# Patient Record
Sex: Female | Born: 1955 | Race: White | Hispanic: No | Marital: Married | Smoking: Never smoker
Health system: Southern US, Community
[De-identification: ages and names within clinical notes are randomized; demographics above are authoritative.]

## PROBLEM LIST (undated history)

## (undated) DIAGNOSIS — D3A026 Benign carcinoid tumor of the rectum: Secondary | ICD-10-CM

## (undated) DIAGNOSIS — K589 Irritable bowel syndrome without diarrhea: Secondary | ICD-10-CM

## (undated) DIAGNOSIS — Z973 Presence of spectacles and contact lenses: Secondary | ICD-10-CM

## (undated) DIAGNOSIS — N950 Postmenopausal bleeding: Secondary | ICD-10-CM

## (undated) DIAGNOSIS — Z8719 Personal history of other diseases of the digestive system: Secondary | ICD-10-CM

## (undated) DIAGNOSIS — K219 Gastro-esophageal reflux disease without esophagitis: Secondary | ICD-10-CM

## (undated) DIAGNOSIS — N840 Polyp of corpus uteri: Secondary | ICD-10-CM

## (undated) DIAGNOSIS — Z860101 Personal history of adenomatous and serrated colon polyps: Secondary | ICD-10-CM

## (undated) DIAGNOSIS — E785 Hyperlipidemia, unspecified: Secondary | ICD-10-CM

## (undated) DIAGNOSIS — Z86010 Personal history of colonic polyps: Secondary | ICD-10-CM

## (undated) DIAGNOSIS — K648 Other hemorrhoids: Secondary | ICD-10-CM

## (undated) DIAGNOSIS — M8580 Other specified disorders of bone density and structure, unspecified site: Secondary | ICD-10-CM

## (undated) HISTORY — DX: Hyperlipidemia, unspecified: E78.5

## (undated) HISTORY — PX: TONSILLECTOMY: SUR1361

## (undated) HISTORY — PX: COLONOSCOPY: SHX174

## (undated) HISTORY — DX: Gastro-esophageal reflux disease without esophagitis: K21.9

## (undated) HISTORY — DX: Benign carcinoid tumor of the rectum: D3A.026

## (undated) HISTORY — DX: Irritable bowel syndrome, unspecified: K58.9

## (undated) HISTORY — DX: Other specified disorders of bone density and structure, unspecified site: M85.80

## (undated) HISTORY — DX: Other hemorrhoids: K64.8

---

## 1983-01-02 HISTORY — PX: DILATION AND CURETTAGE OF UTERUS: SHX78

## 1993-01-01 HISTORY — PX: UMBILICAL HERNIA REPAIR: SHX196

## 1997-08-05 ENCOUNTER — Ambulatory Visit (HOSPITAL_COMMUNITY): Admission: RE | Admit: 1997-08-05 | Discharge: 1997-08-05 | Payer: Self-pay | Admitting: *Deleted

## 1999-08-08 ENCOUNTER — Ambulatory Visit (HOSPITAL_COMMUNITY): Admission: RE | Admit: 1999-08-08 | Discharge: 1999-08-08 | Payer: Self-pay | Admitting: *Deleted

## 2000-02-02 ENCOUNTER — Encounter (INDEPENDENT_AMBULATORY_CARE_PROVIDER_SITE_OTHER): Payer: Self-pay | Admitting: Specialist

## 2000-02-02 ENCOUNTER — Other Ambulatory Visit: Admission: RE | Admit: 2000-02-02 | Discharge: 2000-02-02 | Payer: Self-pay | Admitting: Obstetrics and Gynecology

## 2000-10-04 ENCOUNTER — Ambulatory Visit (HOSPITAL_BASED_OUTPATIENT_CLINIC_OR_DEPARTMENT_OTHER): Admission: RE | Admit: 2000-10-04 | Discharge: 2000-10-04 | Payer: Self-pay | Admitting: Orthopedic Surgery

## 2000-10-04 HISTORY — PX: WRIST GANGLION EXCISION: SHX840

## 2000-10-23 ENCOUNTER — Encounter: Payer: Self-pay | Admitting: Internal Medicine

## 2000-10-23 ENCOUNTER — Ambulatory Visit (HOSPITAL_COMMUNITY): Admission: RE | Admit: 2000-10-23 | Discharge: 2000-10-23 | Payer: Self-pay | Admitting: Internal Medicine

## 2000-11-12 ENCOUNTER — Other Ambulatory Visit: Admission: RE | Admit: 2000-11-12 | Discharge: 2000-11-12 | Payer: Self-pay | Admitting: Obstetrics and Gynecology

## 2001-05-20 ENCOUNTER — Other Ambulatory Visit: Admission: RE | Admit: 2001-05-20 | Discharge: 2001-05-20 | Payer: Self-pay | Admitting: Obstetrics and Gynecology

## 2001-06-12 ENCOUNTER — Ambulatory Visit (HOSPITAL_COMMUNITY): Admission: RE | Admit: 2001-06-12 | Discharge: 2001-06-12 | Payer: Self-pay | Admitting: Internal Medicine

## 2001-06-12 ENCOUNTER — Encounter: Payer: Self-pay | Admitting: Internal Medicine

## 2001-10-29 ENCOUNTER — Ambulatory Visit (HOSPITAL_COMMUNITY): Admission: RE | Admit: 2001-10-29 | Discharge: 2001-10-29 | Payer: Self-pay | Admitting: Obstetrics and Gynecology

## 2001-10-29 ENCOUNTER — Encounter: Payer: Self-pay | Admitting: Obstetrics and Gynecology

## 2001-11-13 ENCOUNTER — Other Ambulatory Visit: Admission: RE | Admit: 2001-11-13 | Discharge: 2001-11-13 | Payer: Self-pay | Admitting: Obstetrics and Gynecology

## 2002-11-24 ENCOUNTER — Ambulatory Visit (HOSPITAL_COMMUNITY): Admission: RE | Admit: 2002-11-24 | Discharge: 2002-11-24 | Payer: Self-pay | Admitting: Obstetrics and Gynecology

## 2002-12-03 ENCOUNTER — Other Ambulatory Visit: Admission: RE | Admit: 2002-12-03 | Discharge: 2002-12-03 | Payer: Self-pay | Admitting: Obstetrics and Gynecology

## 2003-11-30 ENCOUNTER — Ambulatory Visit (HOSPITAL_COMMUNITY): Admission: RE | Admit: 2003-11-30 | Discharge: 2003-11-30 | Payer: Self-pay | Admitting: Obstetrics and Gynecology

## 2003-12-13 ENCOUNTER — Other Ambulatory Visit: Admission: RE | Admit: 2003-12-13 | Discharge: 2003-12-13 | Payer: Self-pay | Admitting: Obstetrics and Gynecology

## 2004-10-09 ENCOUNTER — Ambulatory Visit: Payer: Self-pay | Admitting: Gastroenterology

## 2004-10-16 ENCOUNTER — Ambulatory Visit: Payer: Self-pay | Admitting: Gastroenterology

## 2004-11-10 ENCOUNTER — Ambulatory Visit: Payer: Self-pay | Admitting: Gastroenterology

## 2004-11-29 ENCOUNTER — Ambulatory Visit: Payer: Self-pay | Admitting: Gastroenterology

## 2004-12-12 ENCOUNTER — Ambulatory Visit: Payer: Self-pay | Admitting: Gastroenterology

## 2004-12-12 ENCOUNTER — Encounter (INDEPENDENT_AMBULATORY_CARE_PROVIDER_SITE_OTHER): Payer: Self-pay | Admitting: *Deleted

## 2004-12-12 DIAGNOSIS — D126 Benign neoplasm of colon, unspecified: Secondary | ICD-10-CM | POA: Insufficient documentation

## 2004-12-12 DIAGNOSIS — K2980 Duodenitis without bleeding: Secondary | ICD-10-CM | POA: Insufficient documentation

## 2004-12-12 HISTORY — PX: ESOPHAGOGASTRODUODENOSCOPY: SHX1529

## 2004-12-28 ENCOUNTER — Other Ambulatory Visit: Admission: RE | Admit: 2004-12-28 | Discharge: 2004-12-28 | Payer: Self-pay | Admitting: Obstetrics and Gynecology

## 2005-01-03 ENCOUNTER — Ambulatory Visit: Payer: Self-pay | Admitting: Gastroenterology

## 2005-01-23 ENCOUNTER — Ambulatory Visit (HOSPITAL_COMMUNITY): Admission: RE | Admit: 2005-01-23 | Discharge: 2005-01-23 | Payer: Self-pay | Admitting: Internal Medicine

## 2005-02-12 ENCOUNTER — Ambulatory Visit: Payer: Self-pay | Admitting: Gastroenterology

## 2005-05-14 ENCOUNTER — Ambulatory Visit: Payer: Self-pay | Admitting: Gastroenterology

## 2005-08-22 ENCOUNTER — Ambulatory Visit: Payer: Self-pay | Admitting: Gastroenterology

## 2005-12-04 ENCOUNTER — Other Ambulatory Visit: Admission: RE | Admit: 2005-12-04 | Discharge: 2005-12-04 | Payer: Self-pay | Admitting: Obstetrics and Gynecology

## 2006-03-01 ENCOUNTER — Encounter: Admission: RE | Admit: 2006-03-01 | Discharge: 2006-03-01 | Payer: Self-pay | Admitting: Obstetrics and Gynecology

## 2006-05-29 ENCOUNTER — Ambulatory Visit: Payer: Self-pay

## 2006-06-03 ENCOUNTER — Ambulatory Visit: Payer: Self-pay | Admitting: Gastroenterology

## 2006-11-13 ENCOUNTER — Ambulatory Visit: Payer: Self-pay | Admitting: Internal Medicine

## 2006-12-06 ENCOUNTER — Other Ambulatory Visit: Admission: RE | Admit: 2006-12-06 | Discharge: 2006-12-06 | Payer: Self-pay | Admitting: Obstetrics and Gynecology

## 2007-02-18 DIAGNOSIS — K589 Irritable bowel syndrome without diarrhea: Secondary | ICD-10-CM | POA: Insufficient documentation

## 2007-02-18 DIAGNOSIS — E785 Hyperlipidemia, unspecified: Secondary | ICD-10-CM | POA: Insufficient documentation

## 2007-02-18 DIAGNOSIS — K219 Gastro-esophageal reflux disease without esophagitis: Secondary | ICD-10-CM | POA: Insufficient documentation

## 2007-02-18 DIAGNOSIS — N6019 Diffuse cystic mastopathy of unspecified breast: Secondary | ICD-10-CM | POA: Insufficient documentation

## 2007-02-18 DIAGNOSIS — D509 Iron deficiency anemia, unspecified: Secondary | ICD-10-CM | POA: Insufficient documentation

## 2007-04-08 ENCOUNTER — Encounter: Admission: RE | Admit: 2007-04-08 | Discharge: 2007-04-08 | Payer: Self-pay | Admitting: Obstetrics and Gynecology

## 2007-05-20 ENCOUNTER — Encounter: Payer: Self-pay | Admitting: Internal Medicine

## 2007-07-10 ENCOUNTER — Encounter: Admission: RE | Admit: 2007-07-10 | Discharge: 2007-07-10 | Payer: Self-pay | Admitting: Orthopedic Surgery

## 2007-08-05 ENCOUNTER — Telehealth: Payer: Self-pay | Admitting: Internal Medicine

## 2007-12-09 ENCOUNTER — Encounter: Payer: Self-pay | Admitting: Obstetrics and Gynecology

## 2007-12-09 ENCOUNTER — Ambulatory Visit: Payer: Self-pay | Admitting: Obstetrics and Gynecology

## 2007-12-09 ENCOUNTER — Other Ambulatory Visit: Admission: RE | Admit: 2007-12-09 | Discharge: 2007-12-09 | Payer: Self-pay | Admitting: Obstetrics and Gynecology

## 2008-06-02 ENCOUNTER — Encounter: Admission: RE | Admit: 2008-06-02 | Discharge: 2008-06-02 | Payer: Self-pay | Admitting: Obstetrics and Gynecology

## 2008-12-09 ENCOUNTER — Ambulatory Visit: Payer: Self-pay | Admitting: Obstetrics and Gynecology

## 2008-12-09 ENCOUNTER — Other Ambulatory Visit: Admission: RE | Admit: 2008-12-09 | Discharge: 2008-12-09 | Payer: Self-pay | Admitting: Obstetrics and Gynecology

## 2009-02-08 ENCOUNTER — Ambulatory Visit: Payer: Self-pay | Admitting: Obstetrics and Gynecology

## 2009-06-23 ENCOUNTER — Encounter: Admission: RE | Admit: 2009-06-23 | Discharge: 2009-06-23 | Payer: Self-pay | Admitting: Obstetrics and Gynecology

## 2009-10-24 ENCOUNTER — Encounter (INDEPENDENT_AMBULATORY_CARE_PROVIDER_SITE_OTHER): Payer: Self-pay | Admitting: *Deleted

## 2009-11-28 ENCOUNTER — Encounter (INDEPENDENT_AMBULATORY_CARE_PROVIDER_SITE_OTHER): Payer: Self-pay

## 2009-11-29 ENCOUNTER — Ambulatory Visit: Payer: Self-pay | Admitting: Internal Medicine

## 2009-12-08 ENCOUNTER — Ambulatory Visit: Payer: Self-pay | Admitting: Internal Medicine

## 2009-12-09 ENCOUNTER — Encounter: Payer: Self-pay | Admitting: Internal Medicine

## 2009-12-12 ENCOUNTER — Encounter: Payer: Self-pay | Admitting: Internal Medicine

## 2010-01-05 ENCOUNTER — Ambulatory Visit
Admission: RE | Admit: 2010-01-05 | Discharge: 2010-01-05 | Payer: Self-pay | Source: Home / Self Care | Attending: Licensed Clinical Social Worker | Admitting: Licensed Clinical Social Worker

## 2010-01-18 ENCOUNTER — Other Ambulatory Visit: Payer: Self-pay | Admitting: Obstetrics and Gynecology

## 2010-01-18 ENCOUNTER — Ambulatory Visit
Admission: RE | Admit: 2010-01-18 | Discharge: 2010-01-18 | Payer: Self-pay | Source: Home / Self Care | Attending: Obstetrics and Gynecology | Admitting: Obstetrics and Gynecology

## 2010-01-18 ENCOUNTER — Other Ambulatory Visit
Admission: RE | Admit: 2010-01-18 | Discharge: 2010-01-18 | Payer: Self-pay | Source: Home / Self Care | Admitting: Obstetrics and Gynecology

## 2010-01-21 ENCOUNTER — Encounter: Payer: Self-pay | Admitting: Obstetrics and Gynecology

## 2010-01-22 ENCOUNTER — Encounter: Payer: Self-pay | Admitting: Obstetrics and Gynecology

## 2010-01-31 NOTE — Progress Notes (Signed)
Summary: recall    Phone Note Call from Patient Call back at [phone]   Caller: Patient Call For: GESSNER Reason for Call: Talk to Nurse Details for Reason: recall  Summary of Call: pt would like to know if Dr Leone Payor would like pt to come in this year for a COLON? (recall in [facility] for 12-2007 with Dr Corinda Gubler) Initial call taken by: Guadlupe [facility],  August 05, 2007 4:15 PM  Follow-up for Phone Call        will have chart reviewed by Dr Leone Payor and we will contact pt when reviewed. Follow-up by: Darcey Nora RN,  August 05, 2007 4:41 PM  Additional Follow-up for Phone Call Additional follow up Details #1::        I have reviewed chart, she had one 7 mm adenoma in 2006 (Dec) Current guidelines and standards of care indicate she should have a repeat routine colonoscopy in 12/2009 (5 yrs after last one) If she is having GI symptoms that are changed or new, might indicate a sooner exam. Can set up REV as needed. Additional Follow-up by: Iva Boop MD,  August 06, 2007 1:36 PM    Additional Follow-up for Phone Call Additional follow up Details #2::    pt notified of above, recall entered in IDX for 12-2009 Follow-up by: Darcey Nora RN,  August 06, 2007 1:42 PM

## 2010-01-31 NOTE — Miscellaneous (Signed)
Summary: Lec previsit  Clinical Lists Changes  Medications: Added new medication of MOVIPREP 100 GM  SOLR (PEG-KCL-NACL-NASULF-NA ASC-C) As per prep instructions. - Signed Rx of MOVIPREP 100 GM  SOLR (PEG-KCL-NACL-NASULF-NA ASC-C) As per prep instructions.;  #1 x 0;  Signed;  Entered by: Ulis Rias RN;  Authorized by: Iva Boop MD, [facility];  Method used: [facility]  [place] [address], [place], [place]  [address], Ph: [phone] or [phone], Fax: [phone] Allergies: Added new allergy or adverse reaction of * SULFA DRUGS Observations: Added new observation of NKA: F (11/29/2009 7:53)    Prescriptions: MOVIPREP 100 GM  SOLR (PEG-KCL-NACL-NASULF-NA ASC-C) As per prep instructions.  #1 x 0   Entered by:   Ulis Rias RN   Authorized by:   Iva Boop MD, [facility]   Signed by:   Ulis Rias RN on 11/29/2009   Method used:   [facility]        [facility]  [place] [address]       [address]       [place], [place]  [address]       Ph: [phone] or [phone]       Fax: [phone]   RxID:   [id number]

## 2010-01-31 NOTE — Letter (Signed)
Summary: [facility] Instructions  [facility]  [address] [place], [place] [address]   Phone: [phone]  Fax: [phone]       Valerie Hubbard    03-May-1955    MRN: [id number]        Procedure Day /Date:  12/08/09   Thursday     Arrival Time:  8:00am      Procedure Time:  9:00am     Location of Procedure:                    _ x_  [facility] (4th Floor)   PREPARATION FOR COLONOSCOPY WITH MOVIPREP   Starting 5 days prior to your procedure _ 12/3/11_ do not eat nuts, seeds, popcorn, corn, beans, peas,  salads, or any raw vegetables.  Do not take any fiber supplements (e.g. Metamucil, Citrucel, and Benefiber).  THE DAY BEFORE YOUR PROCEDURE         DATE:  12/07/09   DAY:  Wednesday  1.  Drink clear liquids the entire day-NO SOLID FOOD  2.  Do not drink anything colored red or purple.  Avoid juices with pulp.  No orange juice.  3.  Drink at least 64 oz. (8 glasses) of fluid/clear liquids during the day to prevent dehydration and help the prep work efficiently.  CLEAR LIQUIDS INCLUDE: Water Jello Ice Popsicles Tea (sugar ok, no milk/cream) Powdered fruit flavored drinks Coffee (sugar ok, no milk/cream) Gatorade Juice: apple, white grape, white cranberry  Lemonade Clear bullion, consomm, broth Carbonated beverages (any kind) Strained chicken noodle soup Hard Candy                             4.  In the morning, mix first dose of MoviPrep solution:    Empty 1 Pouch A and 1 Pouch B into the disposable container    Add lukewarm drinking water to the top line of the container. Mix to dissolve    Refrigerate (mixed solution should be used within 24 hrs)  5.  Begin drinking the prep at 5:00 p.m. The MoviPrep container is divided by 4 marks.   Every 15 minutes drink the solution down to the next mark (approximately 8 oz) until the full liter is complete.   6.  Follow completed prep with 16 oz of clear liquid of your choice (Nothing red or  purple).  Continue to drink clear liquids until bedtime.  7.  Before going to bed, mix second dose of MoviPrep solution:    Empty 1 Pouch A and 1 Pouch B into the disposable container    Add lukewarm drinking water to the top line of the container. Mix to dissolve    Refrigerate  THE DAY OF YOUR PROCEDURE      DATE:  12/08/09  DAY:  Thursday  Beginning at  4:00 a.m. (5 hours before procedure):         1. Every 15 minutes, drink the solution down to the next mark (approx 8 oz) until the full liter is complete.  2. Follow completed prep with 16 oz. of clear liquid of your choice.    3. You may drink clear liquids until  7:00am  (2 HOURS BEFORE PROCEDURE).   MEDICATION INSTRUCTIONS  Unless otherwise instructed, you should take regular prescription medications with a small sip of water   as early as possible the morning of your procedure.  OTHER INSTRUCTIONS  You will need a responsible adult at least 55 years of age to accompany you and drive you home.   This person must remain in the waiting room during your procedure.  Wear loose fitting clothing that is easily removed.  Leave jewelry and other valuables at home.  However, you may wish to bring a book to read or  an iPod/MP3 player to listen to music as you wait for your procedure to start.  Remove all body piercing jewelry and leave at home.  Total time from sign-in until discharge is approximately 2-3 hours.  You should go home directly after your procedure and rest.  You can resume normal activities the  day after your procedure.  The day of your procedure you should not:   Drive   Make legal decisions   Operate machinery   Drink alcohol   Return to work  You will receive specific instructions about eating, activities and medications before you leave.    The above instructions have been reviewed and explained to me by   Ulis Rias RN  November 29, 2009 8:22 AM     I fully understand and  can verbalize these instructions _____________________________ Date _________

## 2010-01-31 NOTE — Miscellaneous (Signed)
Summary: Referral  Clinical Lists Changes Patient scheduled to see Dr. Violeta Gelinas for Surgical referral for symptomatic hemorrhoids. Patient scheduled for 12/21/09 @9 :20am. Left message to call back   Selinda Michaels  Spoke with patient and she is aware of appointment date and time. Selinda Michaels RN Orders: Added new Test order of [facility] ([facility]) - Signed

## 2010-01-31 NOTE — Procedures (Signed)
Summary: Gastroenterology colon  Gastroenterology colon   Imported By: Harrietta Guardian 02/20/2007 10:37:35  _____________________________________________________________________  External Attachment:    Type:   Image     Comment:   External Document

## 2010-01-31 NOTE — Procedures (Signed)
Summary: Gastroenterology egd  Gastroenterology egd   Imported By: Harrietta Guardian 02/20/2007 10:38:07  _____________________________________________________________________  External Attachment:    Type:   Image     Comment:   External Document

## 2010-01-31 NOTE — Letter (Signed)
Summary: Pre Visit Letter Revised  [facility]  [address] [place], [place] [address]   Phone: [phone]  Fax: [phone]        10/24/2009 MRN: [id number] [facility] [address] [place], [place]  [address]             Procedure Date:  12/08/2009   Welcome to the Gastroenterology Division at [facility].    You are scheduled to see a nurse for your pre-procedure visit on 11/29/2009 at 8:00AM on the 3rd floor at [facility], [address]  We ask that you try to arrive at our office 15 minutes prior to your appointment time to allow for check-in.  Please take a minute to review the attached form.  If you answer "Yes" to one or more of the questions on the first page, we ask that you call the person listed at your earliest opportunity.  If you answer "No" to all of the questions, please complete the rest of the form and bring it to your appointment.    Your nurse visit will consist of discussing your medical and surgical history, your immediate family medical history, and your medications.   If you are unable to list all of your medications on the form, please bring the medication bottles to your appointment and we will list them.  We will need to be aware of both prescribed and over the counter drugs.  We will need to know exact dosage information as well.    Please be prepared to read and sign documents such as consent forms, a financial agreement, and acknowledgement forms.  If necessary, and with your consent, a friend or relative is welcome to sit-in on the nurse visit with you.  Please bring your insurance card so that we may make a copy of it.  If your insurance requires a referral to see a specialist, please bring your referral form from your primary care physician.  No co-pay is required for this nurse visit.     If you cannot keep your appointment, please call [phone] to cancel or reschedule prior to your appointment date.  This  allows [place] the opportunity to schedule an appointment for another patient in need of care.    Thank you for choosing [facility] for your medical needs.  We appreciate the opportunity to care for you.  Please visit [place] at our website  to learn more about our practice.  Sincerely, The Gastroenterology Division

## 2010-02-02 NOTE — Procedures (Signed)
Summary: Colonoscopy  Patient: Valerie Hubbard Note: All result statuses are Final unless otherwise noted.  Tests: (1) Colonoscopy (COL)   COL Colonoscopy           DONE     [facility]     [address]     [place], [place]  [address]           COLONOSCOPY PROCEDURE REPORT           PATIENT:  Valerie, Hubbard  MR#:  [id number]     BIRTHDATE:  03/01/1955, 54 yrs. old  GENDER:  female     ENDOSCOPIST:  Iva Boop, MD, [facility]           PROCEDURE DATE:  12/08/2009     PROCEDURE:  Colonoscopy with snare polypectomy     ASA CLASS:  Class II     INDICATIONS:  surveillance and high-risk screening, history of     pre-cancerous (adenomatous) colon polyps, family Hx of polyps     (father and siblings), family history of colon cancer (2nd degree     and more distant relatives0     MEDICATIONS:   Fentanyl 75 mcg IV, Versed 9 mg IV           DESCRIPTION OF PROCEDURE:   After the risks benefits and     alternatives of the procedure were thoroughly explained, informed     consent was obtained.  Digital rectal exam was performed and     revealed no abnormalities.   The LB [id number]-[id number] endoscope     was introduced through the anus and advanced to the cecum, which     was identified by both the appendix and ileocecal valve, without     limitations.  The quality of the prep was good, using MoviPrep.     The instrument was then slowly withdrawn as the colon was fully     examined. Insertion: 7:55 minutes Withdrawal: 10:57 minutes     <<PROCEDUREIMAGES>>           FINDINGS:  A sessile polyp was found in the sigmoid colon. It was     7 - 8 mm in size. Polyp was snared, then cauterized with monopolar     cautery. Retrieval was successful. This was otherwise a normal     examination of the colon. including right colon retroflexion.     Retroflexed views in the rectum revealed internal and external     hemorrhoids.  Mostly internal.  The scope was then withdrawn from     the patient and  the procedure completed.           COMPLICATIONS:  None     ENDOSCOPIC IMPRESSION:     1) 7 - 8 mm sessile polyp in the sigmoid colon     2) Otherwise normal examination     3) Internal and external hemorrhoids     4) Personal history of 7 mm adenoma removed 2006     5) Family history of polyps (first-degree relatives) and colon     cancer suspected in more distant relatives     RECOMMENDATIONS:     Surgical referral will be made for symptomatic hemorrhoids -     recurrent bleeding.     Offer deep sedation next exam.           REPEAT EXAM:  In for Colonoscopy, pending biopsy results.           Iva Boop, MD,  FACG           CC:  [name], DO     The Patient           n.     eSIGNED:   Iva Boop at 12/08/2009 09:42 AM           Valerie Hubbard, [id number]  Note: An exclamation mark (!) indicates a result that was not dispersed into the flowsheet. Document Creation Date: 12/08/2009 9:42 AM _______________________________________________________________________  (1) Order result status: Final Collection or observation date-time: 12/08/2009 09:28 Requested date-time:  Receipt date-time:  Reported date-time:  Referring Physician:   Ordering Physician: Stan Head [phone]) Specimen Source:  Source: Launa Grill Order Number: [id number] Lab site:   Appended Document: Colonoscopy   Colonoscopy  Procedure date:  12/08/2009  Findings:          1) 7 - 8 mm sessile polyp in the sigmoid colon     2) Otherwise normal examination     3) Internal and external hemorrhoids     4) Personal history of 7 mm adenoma removed 2006     5) Family history of polyps (first-degree relatives) and colon     cancer suspected in more distant relatives  1. Colon, polyp(s), left (sigmoid) :  - TUBULAR ADENOMA.  NO HIGH GRADE DYSPLASIA OR MALIGNANCY IDENTIFIED.  Comments:      Repeat colonoscopy in 5 years.   Procedures Next Due Date:    Colonoscopy: 12/2014   Appended  Document: Colonoscopy     Procedures Next Due Date:    Colonoscopy: 12/2014

## 2010-02-02 NOTE — Letter (Signed)
Summary: Patient Notice- Polyp Results  [facility]  [address] [place], [place] [address]   Phone: [phone]  Fax: [phone]        December 12, 2009 MRN: [id number]    [facility] [address] [place], [place]  [address]    Dear Ms. Stare,  The polyp removed from your colon was adenomatous. This means that it was pre-cancerous or that  it had the potential to change into cancer over time.  I recommend that you have a repeat colonoscopy in 5 years to determine if you have developed any new polyps over time and screen for colorectal cancer. If you develop any new rectal bleeding, abdominal pain or significant bowel habit changes, please contact us before then.  In addition to repeating colonoscopy, changing health habits may reduce your risk of having more colon or rectal  polyps and possibly, colorectal cancer. You may lower your risk of future polyps and colorectal cancer by adopting healthy habits such as not smoking or using tobacco (if you do), being physically active, losing weight (if overweight), and eating a diet which includes fruits and vegetables and limits red meat.  Please call us if you are having persistent problems or have questions about your condition that have not been fully answered at this time.  Sincerely,  Iva Boop MD, [facility]  This letter has been electronically signed by your physician.  Appended Document: Patient Notice- Polyp Results Letter mailed

## 2010-05-16 NOTE — Assessment & Plan Note (Signed)
[facility]                         GASTROENTEROLOGY OFFICE NOTE   NAME:Hubbard, Valerie RHODES                        MRN:          [id number]  DATE:11/13/2006                            DOB:          09/11/1955    CHIEF COMPLAINT:  Bloating, alternating bowel habits.   Valerie Hubbard is a lady previously followed by Dr. Corinda Gubler.  She has a  diagnosis of irritable bowel syndrome.  She had a colonoscopy in January  2007, as well as an EGD at that time.  She had an adenomatous polyp of  the colon and an EGD showing some reflux changes.  She also had some  duodenitis.  She has a lot of problems with alternating bowel habits and  bloating.  She is unable to pinpoint any particular cause.  She thinks  there may be some relationship to her hormones and menopause.  She  describes a pressure and urge to defecate and an inability to do so,  especially an inability to completely defecate.  This is about three out  of the four weeks.  When she does have menses, she feels better with  less problems.  She thinks her hemorrhoids are somewhat irritated from  stool, though she is not having bleeding.  She has a lot of stress, but  feels that that is much less than it has been.  She had tried Effexor,  but that made her feel funny and really did not seem to help things, so  she stopped that.   SOCIAL HISTORY:  She does use some alcohol.   PAST MEDICAL HISTORY:  She has a normal DEXAscan.  Her full past medical  history is outlined in notes from Dr. Elisabeth Hubbard and she does have  hyperlipidemia, allergies, umbilical hernia surgery and previous tubal  ligation.  Fibrocystic breast disease.  History of anemia with iron  deficiency.  She has chronic insomnia, the irritable bowel syndrome.  She has had a tonsillectomy, as well.   REVIEW OF SYSTEMS:  Her stressors are not that great right now.   MEDICATIONS:  Are listed and reviewed in the chart.   PHYSICAL:  Pleasant, fatigued,  diminutive white woman, height 5 feet 4.5  inches, weight 110.8 pounds.  Pulse 70, blood pressure 102/60.  ABDOMEN:  Scaphoid, soft, nontender, no organomegaly or mass.  Inspection of the perianal area with female nurses present shows no  hemorrhoids or problems there at this time.   ASSESSMENT:  Irritable bowel syndrome, mixed, constipation more so than  diarrhea.  She has internal hemorrhoids, which we know from her  colonoscopy.  These are not visible today, but have been symptomatic.   RECOMMENDATIONS AND PLAN:  She has options to consider, which include  taking Xifaxan, a trial of gluten-free diet.  (Duodenal biopsies and  antibodies have been negative, though the duodenal biopsies suggested  some duodenitis, there is no description in the path report.)  we could  try probiotics.  We could try Amitiza.   PLAN:  1. We are going to try Xifaxan and I am going to have her duodenal  path report reviewed.  I will call her with those results and she      will let me know how the Xifaxan did.  Further plans pending that.  2. I talked to her about vitamin D dosing today.  She is on some and I      recommended she have at least 1000 units a day.  She may need a      level of that checked at some point, but that should be done      through primary care.     Valerie Boop, MD,FACG  Electronically Signed    CEG/MedQ  DD: 11/14/2006  DT: 11/15/2006  Job #: [id number]   cc:   Valerie Hubbard, D.O.

## 2010-05-16 NOTE — Assessment & Plan Note (Signed)
Valerie Hubbard                         Valerie Hubbard   NAME:Valerie Hubbard, Valerie Hubbard                        MRN:          [id number]  DATE:06/03/2006                            DOB:          [birth year]    Valerie Hubbard comes in on June 2 complaining of abdominal pain and hemorrhoids,  occasional flaring up and bloating.  She describes having difficulty  with choking and tightness in her throat.  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  She also has some trouble at this time with hemorrhoids and  gas.  Valerie Hubbard is a very nice young lady, but I think anxiety really plays a  significant part in her symptomatology.  She does have GERD and  irritable bowel syndrome, possibly some lactose intolerance and  dyspepsia as well as hyperlipidemia, some allergies, and is status post  tubal ligation.   PHYSICAL EXAMINATION:  She looked fine.  She weighs 109, blood pressure 116/70, pulse 60 and regular.  Neck, heart and extremities are unremarkable.  Her oropharynx  was negative.  NECK:  Negative.  CHEST:  Clear.  HEART:  Revealed a regular rhythm.  ABDOMEN:  Soft, no masses or organomegaly, nontender.  No bruit, rubs.  EXTREMITIES:  Unremarkable.  RECTAL:  Deferred.   IMPRESSION:  1. 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  2. Irritable bowel syndrome.  3. Gastroesophageal reflux disease.  4. Anxiety/depression.  5. Possible lactose intolerance.  6. Hemorrhoids, internal, she has no bleeding.   RECOMMENDATIONS:  She is to use some Analpram HC cream for her  hemorrhoids 2.5% as needed.  Use Wet Ones or baby wipes.  Gave her some  instructions on hemorrhoids.  To be treated for her anxiety, I told her  I was going to leave this up to her primary care doctor, Valerie Hubbard.  I want her to put on some Flora-Q, told her to try some Peri-Colace,  more than just the Colace.  Then I gave her  some Zegerid to try.  She  does not like taking medications, and I think that is one of her  problems.  The medicine she presently takes has been some Zegerid,  multivitamin, calcium, estradiol and Prometrium, as well as occasional  MiraLax p.r.n. and some Ambien p.r.n.  She has taken Xanax 5 mg as well  and used Anusol suppositories p.r.n.  I told her I would let Valerie  Elisabeth Hubbard know about her anxiety, and I think, hopefully, this will be  helpful to her.  I did see her stress test and read it to her, that it  was probably, overall, normal, or that  there was no diagnostic evidence of left ventricle region wall motion  abnormalities or exercise-induced ischemia.     Ulyess Mort, MD  Electronically Signed    SML/MedQ  DD: 06/03/2006  DT: 06/04/2006  Job #: [id number]   cc:   Lovenia Kim, D.O.

## 2010-05-19 NOTE — Assessment & Plan Note (Signed)
[facility]                           GASTROENTEROLOGY OFFICE NOTE   NAME:Primeau, LATEEFAH MALLERY                        MRN:          [id number]  DATE:08/22/2005                            DOB:          01-Dec-1955    Lasonia comes in on August 22, here for her 59-month followup.  No GI  complaints.  She is here for some constipation.  Some tightness in the  esophagus, but no choking.   I had a long talk with Corrie Dandy.  She says that she talked to the psychologist,  Dr. Andee Poles several times, but she really did not want to follow up.  She thought she was doing alright and the stressors would be over in  December.  She did not want to take the medications anymore except for the  Zegerid and vitamins.  She is not taking her Prevacid.  She does take some  Ambien p.r.n.  She does not like taking the Xanax.  I told her that we would  see how she does over the next 3 months.  We had a long discussion about her  stressors and I think there is some insight here, but I really think there  is some underlying anxiety.  She did talk about some anger.  If worse comes  to worse, I will consider having her calling Dr. Nolen Mu to see if she  would see her herself, if the patient desires.                                   Ulyess Mort, MD   SML/MedQ  DD:  08/22/2005  DT:  08/23/2005  Job #:  [id number]

## 2010-05-19 NOTE — Op Note (Signed)
[place]. [facility]  Patient:    Valerie Hubbard, Valerie Hubbard Visit Number: [id number] MRN: [id number]          Service Type: DSU Location: [facility] Attending Physician:  Teena Dunk Dictated by:   Sharlot Gowda., M.D. Admit Date:  10/04/2000                             Operative Report  PREOPERATIVE DIAGNOSIS:  Painful dorsal ganglion, left wrist.  POSTOPERATIVE DIAGNOSIS:  Painful dorsal ganglion, left wrist.  PROCEDURE:  Excision dorsal wrist ganglion.  SURGEON:  Sharlot Gowda., M.D.  ANESTHESIA:  General.  TOURNIQUET TIME:  Approximately 22 minutes.  INDICATION:  A 55 year old female who complains of a ganglion of the left wrist, thought to be amenable to outpatient excision.  DESCRIPTION OF PROCEDURE:  Sterile prep and drape.  Exsanguination of the arm. A small transverse incision was made over dorsal ganglion that measured probably about 2-3 cm.  Extensor tendons were carefully protected.  The dorsal capsule was reflected, revealing a ganglion that probably arose in the joints between the proximal and distal carpal row, although the exact location of the small neck could not be ascertained.  It was completely resected.  It was not thought to be necessary to send it to pathology.  A small rent in the capsule was debrided down to a rough bony edge.  The capsule was allowed to lay on top of the slight bony defect.  Extensor tendons were again protected.  Closure of the skin was effected with 5-0 nylon.  Marcaine without epinephrine injected into the wound.  A lightly compressive sterile dressing and a volar splint with slight extension was placed.  Taken to the recovery room in stable condition. Dictated by:   Sharlot Gowda., M.D. Attending Physician:  Teena Dunk DD:  10/04/00 TD:  10/05/00 Job: [id number]

## 2010-07-13 ENCOUNTER — Other Ambulatory Visit: Payer: Self-pay | Admitting: Obstetrics and Gynecology

## 2010-07-13 DIAGNOSIS — Z1231 Encounter for screening mammogram for malignant neoplasm of breast: Secondary | ICD-10-CM

## 2010-07-25 ENCOUNTER — Ambulatory Visit
Admission: RE | Admit: 2010-07-25 | Discharge: 2010-07-25 | Disposition: A | Discharge status: Home / Self Care | Payer: BC Managed Care – PPO | Source: Ambulatory Visit | Attending: Obstetrics and Gynecology | Admitting: Obstetrics and Gynecology

## 2010-07-25 DIAGNOSIS — Z1231 Encounter for screening mammogram for malignant neoplasm of breast: Secondary | ICD-10-CM

## 2011-02-09 DIAGNOSIS — M8580 Other specified disorders of bone density and structure, unspecified site: Secondary | ICD-10-CM | POA: Insufficient documentation

## 2011-02-09 DIAGNOSIS — N951 Menopausal and female climacteric states: Secondary | ICD-10-CM | POA: Insufficient documentation

## 2011-02-20 ENCOUNTER — Other Ambulatory Visit: Payer: Self-pay | Admitting: Obstetrics and Gynecology

## 2011-02-20 ENCOUNTER — Ambulatory Visit (INDEPENDENT_AMBULATORY_CARE_PROVIDER_SITE_OTHER): Payer: BC Managed Care – PPO

## 2011-02-20 DIAGNOSIS — M899 Disorder of bone, unspecified: Secondary | ICD-10-CM

## 2011-02-20 DIAGNOSIS — M8580 Other specified disorders of bone density and structure, unspecified site: Secondary | ICD-10-CM

## 2011-02-20 DIAGNOSIS — M949 Disorder of cartilage, unspecified: Secondary | ICD-10-CM

## 2011-03-01 ENCOUNTER — Ambulatory Visit (INDEPENDENT_AMBULATORY_CARE_PROVIDER_SITE_OTHER): Payer: BC Managed Care – PPO | Admitting: Obstetrics and Gynecology

## 2011-03-01 ENCOUNTER — Other Ambulatory Visit (HOSPITAL_COMMUNITY)
Admission: RE | Admit: 2011-03-01 | Discharge: 2011-03-01 | Disposition: A | Discharge status: Home / Self Care | Payer: BC Managed Care – PPO | Source: Ambulatory Visit | Attending: Obstetrics and Gynecology | Admitting: Obstetrics and Gynecology

## 2011-03-01 ENCOUNTER — Encounter: Payer: Self-pay | Admitting: Obstetrics and Gynecology

## 2011-03-01 VITALS — BP 110/70 | Ht 64.5 in | Wt 110.0 lb

## 2011-03-01 DIAGNOSIS — Z01419 Encounter for gynecological examination (general) (routine) without abnormal findings: Secondary | ICD-10-CM | POA: Insufficient documentation

## 2011-03-01 MED ORDER — ESTRADIOL-NORETHINDRONE ACET 1-0.5 MG PO TABS: 1.0000 | ORAL_TABLET | Freq: Every day | ORAL | Status: DC

## 2011-03-01 NOTE — Progress Notes (Signed)
Patient came to see me today for her annual GYN exam. She has been on HRT for about 5 years. If she misses just several pills she is severely symptomatic from hot flashes. She would like to continue. She is taking half of an Activella 1 mg daily. She is up-to-date on mammograms. She just  has had a bone density which showed stable osteopenia without an elevated FRAX risk. She is having no pelvic pain. She does her lab through her PCP. She is up-to-date on mammograms.  Physical examination: Kennon Portela present. HEENT within normal limits. Neck: Thyroid not large. No masses. Supraclavicular nodes: not enlarged. Breasts: Examined in both sitting midline position. No skin changes and no masses. Abdomen: Soft no guarding rebound or masses or hernia. Pelvic: External: Within normal limits. BUS: Within normal limits. Vaginal:within normal limits. Good estrogen effect. No evidence of cystocele rectocele or enterocele. Cervix: clean. Uterus: Normal size and shape. Adnexa: No masses. Rectovaginal exam: Confirmatory and negative. Extremities: Within normal limits.  Assessment: #1. Menopausal symptoms #2. Osteopenia  Plan: Discussed a higher risk of breast cancer with HRT. Discussed a higher risk of clot and stroke with oral estrogen. Discussed estrogen patch. The moment patient will continue Activella 1 mg half tablet daily. She will call when necessary if she wants to try the patch. She will continue yearly mammograms. She will continue periodic bone densities.

## 2011-07-19 ENCOUNTER — Other Ambulatory Visit: Payer: Self-pay | Admitting: Obstetrics and Gynecology

## 2011-07-19 DIAGNOSIS — Z1231 Encounter for screening mammogram for malignant neoplasm of breast: Secondary | ICD-10-CM

## 2011-08-02 ENCOUNTER — Ambulatory Visit
Admission: RE | Admit: 2011-08-02 | Discharge: 2011-08-02 | Disposition: A | Discharge status: Home / Self Care | Payer: BC Managed Care – PPO | Source: Ambulatory Visit | Attending: Obstetrics and Gynecology | Admitting: Obstetrics and Gynecology

## 2011-08-02 DIAGNOSIS — Z1231 Encounter for screening mammogram for malignant neoplasm of breast: Secondary | ICD-10-CM

## 2011-08-12 ENCOUNTER — Other Ambulatory Visit: Payer: Self-pay | Admitting: Obstetrics and Gynecology

## 2011-09-06 ENCOUNTER — Telehealth: Payer: Self-pay | Admitting: *Deleted

## 2011-09-06 MED ORDER — ACTIVELLA 1-0.5 MG PO TABS: 1.0000 | ORAL_TABLET | Freq: Every day | ORAL | Status: DC

## 2011-09-06 NOTE — Telephone Encounter (Signed)
Pt called asking for brand only activella, rx at [facility] was sent to pharmacy this way, but DAW was not clicked. Resent rx to pharmacy.

## 2012-06-04 ENCOUNTER — Encounter: Payer: Self-pay | Admitting: Obstetrics and Gynecology

## 2012-06-04 ENCOUNTER — Ambulatory Visit (INDEPENDENT_AMBULATORY_CARE_PROVIDER_SITE_OTHER): Payer: BC Managed Care – PPO | Admitting: Obstetrics and Gynecology

## 2012-06-04 VITALS — BP 102/60 | Ht 64.5 in | Wt 110.0 lb

## 2012-06-04 DIAGNOSIS — Z0000 Encounter for general adult medical examination without abnormal findings: Secondary | ICD-10-CM

## 2012-06-04 DIAGNOSIS — Z01419 Encounter for gynecological examination (general) (routine) without abnormal findings: Secondary | ICD-10-CM

## 2012-06-04 LAB — POCT URINALYSIS DIPSTICK
Bilirubin, UA: NEGATIVE
Blood, UA: NEGATIVE
Glucose, UA: NEGATIVE
Ketones, UA: NEGATIVE
Leukocytes, UA: NEGATIVE
Nitrite, UA: NEGATIVE
Protein, UA: NEGATIVE
Urobilinogen, UA: NEGATIVE
pH, UA: 7

## 2012-06-04 MED ORDER — ESTRADIOL 0.0375 MG/24HR TD PTTW: 1.0000 | MEDICATED_PATCH | TRANSDERMAL | Status: DC

## 2012-06-04 MED ORDER — PROGESTERONE MICRONIZED 100 MG PO CAPS: 100.0000 mg | ORAL_CAPSULE | Freq: Every day | ORAL | Status: DC

## 2012-06-04 NOTE — Patient Instructions (Addendum)

## 2012-06-04 NOTE — Progress Notes (Signed)
57 y.o.   Married    Caucasian   female   [id number]   here for annual exam.  Prev pt of Dr. Verl Dicker.  On activella.  Terrible hot flashes if forgets 3 pills in a row.   Patient's last menstrual period was 01/01/2005.          Sexually active: no  The current method of family planning is tubal ligation and abstinence.    Exercising: [employer], weights 1-2 days a week Last mammogram:  2013 normal Last pap smear:2013 neg History of abnormal pap: yes, many years ago Smoking:never Alcohol:1-2 glasses of wine daily Last colonoscopy:2012 polyps, repeat in 5 years Last Bone Density: 02/20/11 osteopenia  Last tetanus shot: less than 10 years Last cholesterol check: 3/ 2014 slightly elevated  Hgb:  @ work              Urine: neg   Family History  Problem Relation Age of Onset  . Breast cancer Mother   . Hypertension Mother   . Osteoporosis Mother   . Cancer Maternal Grandfather     COLON  . Cancer Maternal Aunt     colon  . Cancer Paternal Aunt     colon  . COPD Father   . Cancer Father     lung cancer    Patient Active Problem List   Diagnosis Date Noted  . Low bone mass   . Menopausal symptom   . HYPERLIPIDEMIA 02/18/2007  . ANEMIA, IRON DEFICIENCY 02/18/2007  . GERD 02/18/2007  . IBS 02/18/2007  . FIBROCYSTIC BREAST DISEASE 02/18/2007  . INSOMNIA 02/18/2007  . COLONIC POLYPS, ADENOMATOUS 12/12/2004  . INTERNAL HEMORRHOIDS 12/12/2004  . ESOPHAGITIS, REFLUX 12/12/2004  . DUODENITIS, WITHOUT HEMORRHAGE 12/12/2004    Past Medical History  Diagnosis Date  . Low bone mass   . Menopausal symptom   . Hemorrhoids     Past Surgical History  Procedure Laterality Date  . Tubal ligation      LAPAROSCOPIC AND UMBILICAL HERNIA REPAIR  . Excision of ganglion cyst    . Dilation and curettage of uterus      MISSED AB  . Wrist surgery    . Umbilical hernia repair      Allergies: Sulfonamide derivatives  Current Outpatient Prescriptions  Medication Sig  Dispense Refill  . Calcium Carbonate-Vitamin D (CALCIUM + D PO) Take by mouth.      . estradiol-norethindrone (ACTIVELLA) 1-0.5 MG per tablet Take 1 tablet by mouth daily. Taking 1/2 pill daily      . fish oil-omega-3 fatty acids 1000 MG capsule Take 2 g by mouth daily.      Marland Kitchen FLAXSEED, LINSEED, PO Take by mouth.      . Hyoscyamine Sulfate (LEVSIN PO) Take by mouth as needed.       . ALPRAZolam (XANAX PO) Take by mouth.      . Multiple Vitamin (MULTIVITAMIN) capsule Take 1 capsule by mouth daily.      Maxwell Caul Bicarbonate (ZEGERID OTC PO) Take by mouth.      . Zolpidem Tartrate (AMBIEN PO) Take by mouth.       No current facility-administered medications for this visit.    ROS: Pertinent items are noted in HPI.  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  Exam:    BP 102/60  Ht 5' 4.5" (1.638 m)  Wt 110 lb (49.896 kg)  BMI 18.6 kg/m2  LMP 01/01/2005  Wt Readings from Last 3 Encounters:  06/04/12 110 lb (49.896 kg)  03/01/11 110 lb (49.896 kg)     Ht Readings from Last 3 Encounters:  06/04/12 5' 4.5" (1.638 m)  03/01/11 5' 4.5" (1.638 m)    General appearance: alert, cooperative and appears stated age Head: Normocephalic, without obvious abnormality, atraumatic Neck: no adenopathy, supple, symmetrical, trachea midline and thyroid not enlarged, symmetric, no tenderness/mass/nodules Lungs: clear to auscultation bilaterally Breasts: Inspection negative, No nipple retraction or dimpling, No nipple discharge or bleeding, No axillary or supraclavicular adenopathy, Normal to palpation without dominant masses Heart: regular rate and rhythm Abdomen: soft, non-tender; bowel sounds normal; no masses,  no organomegaly Extremities: extremities normal, atraumatic, no cyanosis or edema Skin: Skin color, texture, turgor normal. No rashes or lesions Lymph nodes: Cervical, supraclavicular, and axillary nodes  normal. No abnormal inguinal nodes palpated Neurologic: Grossly normal   Pelvic: External genitalia:  no lesions              Urethra:  normal appearing urethra with no masses, tenderness or lesions              Bartholins and Skenes: normal                 Vagina: normal appearing vagina with normal color and discharge, no lesions              Cervix: normal appearance              Pap taken: no        Bimanual Exam:  Uterus:  uterus is normal size, shape, consistency and nontender                                      Adnexa: normal adnexa in size, nontender and no masses                                      Rectovaginal: Confirms                                      Anus:  normal sphincter tone, no lesions  A: normal menopausal exam, on HRT     H/o adenomatous colon polyps     IBS, GERD     P: mammogram counseled on breast self exam, mammography screening, adequate intake of calcium and vitamin D, diet and exercise return annually or prn   With pt fairly frequently on long haul international flights, I would rather her be on transdermal estrogen to negate the DVT risk.  She agrees to try it.  Change activella to minivelle 0.0375 mg twice a week, and Prometrium 100 mg qhs and instructed.  We discussed the increased risk of breast cancer in light of her mother having had post menopausal breast cancer, and pt feels her hot flashes are so intolerable w/o estrogen, she accepts risks and wishes to continue HRT.  She is also quite small in stature, and the HRT will help maintain her bone strength.  And her GERD could make bisphos tx difficult down the road.     An After Visit Summary was printed and given to the patient.

## 2012-07-14 ENCOUNTER — Other Ambulatory Visit: Payer: Self-pay

## 2012-07-14 DIAGNOSIS — Z1231 Encounter for screening mammogram for malignant neoplasm of breast: Secondary | ICD-10-CM

## 2012-08-11 ENCOUNTER — Ambulatory Visit
Admission: RE | Admit: 2012-08-11 | Discharge: 2012-08-11 | Disposition: A | Discharge status: Home / Self Care | Payer: BC Managed Care – PPO | Source: Ambulatory Visit

## 2012-08-11 DIAGNOSIS — Z1231 Encounter for screening mammogram for malignant neoplasm of breast: Secondary | ICD-10-CM

## 2012-08-12 ENCOUNTER — Ambulatory Visit: Payer: BC Managed Care – PPO

## 2012-11-06 ENCOUNTER — Other Ambulatory Visit: Payer: Self-pay

## 2013-03-03 ENCOUNTER — Ambulatory Visit (INDEPENDENT_AMBULATORY_CARE_PROVIDER_SITE_OTHER): Payer: BC Managed Care – PPO | Admitting: Physician Assistant

## 2013-03-03 ENCOUNTER — Encounter: Payer: Self-pay | Admitting: Physician Assistant

## 2013-03-03 VITALS — BP 120/68 | HR 60 | Temp 98.6°F | Resp 16 | Ht 64.0 in | Wt 109.0 lb

## 2013-03-03 DIAGNOSIS — R0602 Shortness of breath: Secondary | ICD-10-CM

## 2013-03-03 DIAGNOSIS — R002 Palpitations: Secondary | ICD-10-CM

## 2013-03-03 DIAGNOSIS — R609 Edema, unspecified: Secondary | ICD-10-CM

## 2013-03-03 DIAGNOSIS — R0600 Dyspnea, unspecified: Secondary | ICD-10-CM

## 2013-03-03 DIAGNOSIS — R0989 Other specified symptoms and signs involving the circulatory and respiratory systems: Secondary | ICD-10-CM

## 2013-03-03 DIAGNOSIS — R0609 Other forms of dyspnea: Secondary | ICD-10-CM

## 2013-03-03 LAB — CBC WITH DIFFERENTIAL/PLATELET
Basophils Absolute: 0.1 10*3/uL (ref 0.0–0.1)
Basophils Relative: 2 % — ABNORMAL HIGH (ref 0–1)
Eosinophils Absolute: 0.1 10*3/uL (ref 0.0–0.7)
Eosinophils Relative: 2 % (ref 0–5)
HCT: 42.5 % (ref 36.0–46.0)
Hemoglobin: 14.3 g/dL (ref 12.0–15.0)
Lymphocytes Relative: 34 % (ref 12–46)
Lymphs Abs: 2.1 10*3/uL (ref 0.7–4.0)
MCH: 31.5 pg (ref 26.0–34.0)
MCHC: 33.6 g/dL (ref 30.0–36.0)
MCV: 93.6 fL (ref 78.0–100.0)
Monocytes Absolute: 0.6 10*3/uL (ref 0.1–1.0)
Monocytes Relative: 9 % (ref 3–12)
Neutro Abs: 3.3 10*3/uL (ref 1.7–7.7)
Neutrophils Relative %: 53 % (ref 43–77)
Platelets: 280 10*3/uL (ref 150–400)
RBC: 4.54 MIL/uL (ref 3.87–5.11)
RDW: 14 % (ref 11.5–15.5)
WBC: 6.3 10*3/uL (ref 4.0–10.5)

## 2013-03-03 LAB — HEPATIC FUNCTION PANEL
ALT: 21 U/L (ref 0–35)
AST: 22 U/L (ref 0–37)
Albumin: 4.5 g/dL (ref 3.5–5.2)
Alkaline Phosphatase: 35 U/L — ABNORMAL LOW (ref 39–117)
Bilirubin, Direct: 0.1 mg/dL (ref 0.0–0.3)
Indirect Bilirubin: 0.5 mg/dL (ref 0.2–1.2)
Total Bilirubin: 0.6 mg/dL (ref 0.2–1.2)
Total Protein: 6.9 g/dL (ref 6.0–8.3)

## 2013-03-03 LAB — BASIC METABOLIC PANEL WITH GFR
BUN: 10 mg/dL (ref 6–23)
CO2: 30 mEq/L (ref 19–32)
Calcium: 9.5 mg/dL (ref 8.4–10.5)
Chloride: 103 mEq/L (ref 96–112)
Creat: 0.73 mg/dL (ref 0.50–1.10)
GFR, Est African American: >89 mL/min
GFR, Est Non African American: >89 mL/min
Glucose, Bld: 87 mg/dL (ref 70–99)
Potassium: 3.8 mEq/L (ref 3.5–5.3)
Sodium: 139 mEq/L (ref 135–145)

## 2013-03-03 LAB — MAGNESIUM: Magnesium: 2.1 mg/dL (ref 1.5–2.5)

## 2013-03-03 NOTE — Patient Instructions (Signed)
Diet for Gastroesophageal Reflux Disease, Adult Reflux (acid reflux) is when acid from your stomach flows up into the esophagus. When acid comes in contact with the esophagus, the acid causes irritation and soreness (inflammation) in the esophagus. When reflux happens often or so severely that it causes damage to the esophagus, it is called gastroesophageal reflux disease (GERD). Nutrition therapy can help ease the discomfort of GERD. FOODS OR DRINKS TO AVOID OR LIMIT  Smoking or chewing tobacco. Nicotine is one of the most potent stimulants to acid production in the gastrointestinal tract.  Caffeinated and decaffeinated coffee and black tea.  Regular or low-calorie carbonated beverages or energy drinks (caffeine-free carbonated beverages are allowed).   Strong spices, such as black pepper, white pepper, red pepper, cayenne, curry powder, and chili powder.  Peppermint or spearmint.  Chocolate.  High-fat foods, including meats and fried foods. Extra added fats including oils, butter, salad dressings, and nuts. Limit these to less than 8 tsp per day.  Fruits and vegetables if they are not tolerated, such as citrus fruits or tomatoes.  Alcohol.  Any food that seems to aggravate your condition. If you have questions regarding your diet, call your caregiver or a registered dietitian. OTHER THINGS THAT MAY HELP GERD INCLUDE:   Eating your meals slowly, in a relaxed setting.  Eating 5 to 6 small meals per day instead of 3 large meals.  Eliminating food for a period of time if it causes distress.  Not lying down until 3 hours after eating a meal.  Keeping the head of your bed raised 6 to 9 inches (15 to 23 cm) by using a foam wedge or blocks under the legs of the bed. Lying flat may make symptoms worse.  Being physically active. Weight loss may be helpful in reducing reflux in overweight or obese adults.  Wear loose fitting clothing EXAMPLE MEAL PLAN This meal plan is approximately  2,000 calories based on [email or web address] meal planning guidelines. Breakfast   cup cooked oatmeal.  1 cup strawberries.  1 cup low-fat milk.  1 oz almonds. Snack  1 cup cucumber slices.  6 oz yogurt (made from low-fat or fat-free milk). Lunch  2 slice whole-wheat bread.  2 oz sliced [place].  2 tsp mayonnaise.  1 cup blueberries.  1 cup snap peas. Snack  6 whole-wheat crackers.  1 oz string cheese. Dinner   cup brown rice.  1 cup mixed veggies.  1 tsp olive oil.  3 oz grilled fish. Document Released: 12/18/2004 Document Revised: 03/12/2011 Document Reviewed: 11/03/2010 [facility] Patient Information 2014 [place], [place]. Palpitations  A palpitation is the feeling that your heartbeat is irregular or is faster than normal. It may feel like your heart is fluttering or skipping a beat. Palpitations are usually not a serious problem. However, in some cases, you may need further medical evaluation. CAUSES  Palpitations can be caused by:  Smoking.  Caffeine or other stimulants, such as diet pills or energy drinks.  Alcohol.  Stress and anxiety.  Strenuous physical activity.  Fatigue.  Certain medicines.  Heart disease, especially if you have a history of arrhythmias. This includes atrial fibrillation, atrial flutter, or supraventricular tachycardia.  An improperly working pacemaker or defibrillator. DIAGNOSIS  To find the cause of your palpitations, your caregiver will take your history and perform a physical exam. Tests may also be done, including:  Electrocardiography (ECG). This test records the heart's electrical activity.  Cardiac monitoring. This allows your caregiver to monitor your heart rate and rhythm  in real time.  Holter monitor. This is a portable device that records your heartbeat and can help diagnose heart arrhythmias. It allows your caregiver to track your heart activity for several days, if needed.  Stress tests by exercise or by  giving medicine that makes the heart beat faster. TREATMENT  Treatment of palpitations depends on the cause of your symptoms and can vary greatly. Most cases of palpitations do not require any treatment other than time, relaxation, and monitoring your symptoms. Other causes, such as atrial fibrillation, atrial flutter, or supraventricular tachycardia, usually require further treatment. HOME CARE INSTRUCTIONS   Avoid:  Caffeinated coffee, tea, soft drinks, diet pills, and energy drinks.  Chocolate.  Alcohol.  Stop smoking if you smoke.  Reduce your stress and anxiety. Things that can help you relax include:  A method that measures bodily functions so you can learn to control them (biofeedback).  Yoga.  Meditation.  Physical activity such as swimming, jogging, or walking.  Get plenty of rest and sleep. SEEK MEDICAL CARE IF:   You continue to have a fast or irregular heartbeat beyond 24 hours.  Your palpitations occur more often. SEEK IMMEDIATE MEDICAL CARE IF:  You develop chest pain or shortness of breath.  You have a severe headache.  You feel dizzy, or you faint. MAKE SURE YOU:  Understand these instructions.  Will watch your condition.  Will get help right away if you are not doing well or get worse. Document Released: 12/16/1999 Document Revised: 04/14/2012 Document Reviewed: 02/16/2011 [employer] Patient Information 2014 [employer].  

## 2013-03-03 NOTE — Progress Notes (Signed)
Subjective:    Patient ID: Valerie Hubbard, female    DOB: 1955/01/30, 58 y.o.   MRN: [id number]  HPI 58 y.o. female presents with heart palpitations for one week. She states she has it intermittent but for the past week it has been every day. Very strong pounding, flip flopping, irregular, last for hours, some lightheaded and fatigue over last month with it and feels the need to cough/deep breath. Nonexertional. Palps and discomfort epigastric pain. Nothing makes it worse, xanax or zegrid helps. On estrogen, history of anemia, GERD. Echo normal 2008. Denies CP, SOB, nausea, changes in speech. Denies family history of heart problems.   Current Outpatient Prescriptions on File Prior to Visit  Medication Sig Dispense Refill  . ALPRAZolam (XANAX PO) Take by mouth.      . Calcium Carbonate-Vitamin D (CALCIUM + D PO) Take by mouth.      . estradiol (MINIVELLE) 0.0375 MG/24HR Place 1 patch onto the skin 2 (two) times a week. Apply anywhere on lower abdomen.  Change patch on Sun am and Wed pm.  12 patch  3  . fish oil-omega-3 fatty acids 1000 MG capsule Take 2 g by mouth daily.      Marland Kitchen Hyoscyamine Sulfate (LEVSIN PO) Take by mouth as needed.       . Multiple Vitamin (MULTIVITAMIN) capsule Take 1 capsule by mouth daily.      Earney Navy Bicarbonate (ZEGERID OTC PO) Take by mouth.      . progesterone (PROMETRIUM) 100 MG capsule Take 1 capsule (100 mg total) by mouth at bedtime.  90 capsule  3  . Zolpidem Tartrate (AMBIEN PO) Take by mouth.       No current facility-administered medications on file prior to visit.   Past Medical History  Diagnosis Date  . Low bone mass   . Menopausal symptom   . Hemorrhoids     Review of Systems  Constitutional: Positive for fatigue. Negative for fever and chills.  HENT: Negative.   Respiratory: Negative.   Cardiovascular: Positive for palpitations. Negative for chest pain and leg swelling.  Gastrointestinal: Negative.   Genitourinary: Negative.    Musculoskeletal: Negative.   Skin: Negative.   Neurological: Positive for light-headedness. Negative for dizziness, numbness and headaches.       Objective:   Physical Exam  Constitutional: She is oriented to person, place, and time. She appears well-developed and well-nourished.  HENT:  Head: Normocephalic and atraumatic.  Right Ear: External ear normal.  Left Ear: External ear normal.  Mouth/Throat: Oropharynx is clear and moist.  Eyes: Conjunctivae and EOM are normal. Pupils are equal, round, and reactive to light.  Neck: Normal range of motion. Neck supple. No thyromegaly present.  Cardiovascular: Normal rate, regular rhythm and normal heart sounds.   Occasional extrasystoles are present. Exam reveals no gallop and no friction rub.   No murmur heard. Pulmonary/Chest: Effort normal and breath sounds normal. No respiratory distress. She has no wheezes.  Abdominal: Soft. Bowel sounds are normal. She exhibits no distension and no mass. There is no tenderness. There is no rebound and no guarding.  Musculoskeletal: Normal range of motion.  Lymphadenopathy:    She has no cervical adenopathy.  Neurological: She is alert and oriented to person, place, and time. She displays normal reflexes. No cranial nerve deficit. Coordination normal.  Skin: Skin is warm and dry.  Psychiatric: She has a normal mood and affect.      Assessment & Plan:  Palpitations- check labs,  r/u anemia, TSH, dehydration, electrolytes, ddimer since low risk but on estrogen Check EKG- shows one PVC but no other abnormalities- patient reassured ? Atypical palpitations- get on PPI for 2 weeks  Follow up in 1 month.

## 2013-03-04 LAB — D-DIMER, QUANTITATIVE: D-Dimer, Quant: 0.33 ug/mL-FEU (ref 0.00–0.48)

## 2013-03-04 LAB — TSH: TSH: 1.595 u[IU]/mL (ref 0.350–4.500)

## 2013-03-27 DIAGNOSIS — G4700 Insomnia, unspecified: Secondary | ICD-10-CM | POA: Insufficient documentation

## 2013-04-01 ENCOUNTER — Ambulatory Visit: Payer: Self-pay | Admitting: Physician Assistant

## 2013-04-02 ENCOUNTER — Ambulatory Visit: Payer: Self-pay | Admitting: Physician Assistant

## 2013-06-09 ENCOUNTER — Ambulatory Visit (INDEPENDENT_AMBULATORY_CARE_PROVIDER_SITE_OTHER): Payer: BC Managed Care – PPO | Admitting: Obstetrics & Gynecology

## 2013-06-09 ENCOUNTER — Ambulatory Visit: Payer: BC Managed Care – PPO | Admitting: Obstetrics and Gynecology

## 2013-06-09 ENCOUNTER — Encounter: Payer: Self-pay | Admitting: Obstetrics & Gynecology

## 2013-06-09 VITALS — BP 102/58 | HR 56 | Resp 12 | Ht 64.5 in | Wt 108.0 lb

## 2013-06-09 DIAGNOSIS — Z01419 Encounter for gynecological examination (general) (routine) without abnormal findings: Secondary | ICD-10-CM

## 2013-06-09 DIAGNOSIS — Z124 Encounter for screening for malignant neoplasm of cervix: Secondary | ICD-10-CM

## 2013-06-09 DIAGNOSIS — Z0000 Encounter for general adult medical examination without abnormal findings: Secondary | ICD-10-CM

## 2013-06-09 LAB — POCT URINALYSIS DIPSTICK
Bilirubin, UA: NEGATIVE
Blood, UA: NEGATIVE
Glucose, UA: NEGATIVE
Ketones, UA: NEGATIVE
Nitrite, UA: NEGATIVE
Protein, UA: NEGATIVE
Urobilinogen, UA: NEGATIVE
pH, UA: 7

## 2013-06-09 MED ORDER — ESTRADIOL-NORETHINDRONE ACET 1-0.5 MG PO TABS: 1.0000 | ORAL_TABLET | Freq: Every day | ORAL | Status: DC
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
## 2013-06-13 NOTE — Addendum Note (Signed)
Addended by: Megan Salon on: 06/13/2013 09:59 PM   Modules accepted: Orders, Medications

## 2013-06-17 LAB — IPS PAP TEST WITH HPV

## 2013-07-14 ENCOUNTER — Ambulatory Visit (INDEPENDENT_AMBULATORY_CARE_PROVIDER_SITE_OTHER): Payer: BC Managed Care – PPO | Admitting: Physician Assistant

## 2013-07-14 ENCOUNTER — Encounter: Payer: Self-pay | Admitting: Physician Assistant

## 2013-07-14 VITALS — BP 110/62 | HR 68 | Temp 98.6°F | Resp 16 | Ht 64.0 in | Wt 110.0 lb

## 2013-07-14 DIAGNOSIS — Z79899 Other long term (current) drug therapy: Secondary | ICD-10-CM

## 2013-07-14 DIAGNOSIS — M8580 Other specified disorders of bone density and structure, unspecified site: Secondary | ICD-10-CM

## 2013-07-14 DIAGNOSIS — E559 Vitamin D deficiency, unspecified: Secondary | ICD-10-CM

## 2013-07-14 DIAGNOSIS — Z1211 Encounter for screening for malignant neoplasm of colon: Secondary | ICD-10-CM

## 2013-07-14 DIAGNOSIS — Z0000 Encounter for general adult medical examination without abnormal findings: Secondary | ICD-10-CM

## 2013-07-14 DIAGNOSIS — Z23 Encounter for immunization: Secondary | ICD-10-CM

## 2013-07-14 LAB — CBC WITH DIFFERENTIAL/PLATELET
Basophils Absolute: 0.1 10*3/uL (ref 0.0–0.1)
Basophils Relative: 2 % — ABNORMAL HIGH (ref 0–1)
Eosinophils Absolute: 0.1 10*3/uL (ref 0.0–0.7)
Eosinophils Relative: 2 % (ref 0–5)
HCT: 41 % (ref 36.0–46.0)
Hemoglobin: 14 g/dL (ref 12.0–15.0)
Lymphocytes Relative: 31 % (ref 12–46)
Lymphs Abs: 2 10*3/uL (ref 0.7–4.0)
MCH: 31.7 pg (ref 26.0–34.0)
MCHC: 34.1 g/dL (ref 30.0–36.0)
MCV: 93 fL (ref 78.0–100.0)
Monocytes Absolute: 0.5 10*3/uL (ref 0.1–1.0)
Monocytes Relative: 8 % (ref 3–12)
Neutro Abs: 3.6 10*3/uL (ref 1.7–7.7)
Neutrophils Relative %: 57 % (ref 43–77)
Platelets: 280 10*3/uL (ref 150–400)
RBC: 4.41 MIL/uL (ref 3.87–5.11)
RDW: 14.5 % (ref 11.5–15.5)
WBC: 6.3 10*3/uL (ref 4.0–10.5)

## 2013-07-14 NOTE — Progress Notes (Signed)
Complete Physical  Assessment and Plan: Osteopenia- continue estrogen, Vitamin D, and weight bearing exercises  Menopausal symptom- continue estrogen  Hemorrhoids- monitored  Anemia- check labs  IBS (irritable bowel syndrome)-controlled  Insomnia- controlled, no AEs on ambien  Hyperlipidemia--continue medications, check lipids, decrease fatty foods, increase activity.   GERD (gastroesophageal reflux disease)-controlled with occ prevacid  Dense breast: get 3D MGM  Discussed med's effects and SE's. Screening labs and tests as requested with regular follow-up as recommended.  HPI 58 y.o. female  presents for a complete physical.  Her blood pressure has been controlled at home, today their BP is BP: 110/62 mmHg She does workout, walks. She denies chest pain, shortness of breath, dizziness.  She is on cholesterol medication and denies myalgias. Her cholesterol is at goal. The cholesterol last visit was:  LDL 127 (107)  Last A1C in the office was: 5.6 She is on minivelle and bASA, will get MGM Patient is on Vitamin D supplement.   Patient states for the last 2 years she has left jaw pain, worse with flying and with chewing. She has seen her dentist and it is not her tooth/normal exam.  Flutters better with zegrid PRN  Current Medications:  Current Outpatient Prescriptions on File Prior to Visit  Medication Sig Dispense Refill  . ALPRAZolam (XANAX PO) Take by mouth.      . Calcium Carbonate-Vitamin D (CALCIUM + D PO) Take by mouth.      . estradiol-norethindrone (ACTIVELLA) 1-0.5 MG per tablet Take 1 tablet by mouth daily.  90 tablet  4  . fish oil-omega-3 fatty acids 1000 MG capsule Take 2 g by mouth daily.      Marland Kitchen Hyoscyamine Sulfate (LEVSIN PO) Take by mouth as needed.       . Multiple Vitamin (MULTIVITAMIN) capsule Take 1 capsule by mouth daily.      Earney Navy Bicarbonate (ZEGERID OTC PO) Take by mouth.      . Zolpidem Tartrate (AMBIEN PO) Take by mouth.       No current  facility-administered medications on file prior to visit.   Health Maintenance:   Immunization History  Administered Date(s) Administered  . Pneumococcal Polysaccharide-23 01/02/2003  . Td 04/15/2003   Tetanus: 2005 Pneumovax: 1997 Flu vaccine: Zostavax: Pap: 2014 MGM: 2014 due in August suggest 3D DEXA: normal 2013 on estrogen, works out, vitamin D at goal, will wait Colonoscopy:2011 due 2016 EGD: 2006  Echo 2008 EF 60%, normal aorta 2013  Allergies:  Allergies  Allergen Reactions  . Sulfonamide Derivatives     REACTION: Patient unsure of reaction, states mother advised her it was a childhood allergy   Medical History:  Past Medical History  Diagnosis Date  . osteopenia   . Menopausal symptom   . Hemorrhoids   . Anemia   . IBS (irritable bowel syndrome)   . Insomnia   . Hyperlipidemia   . GERD (gastroesophageal reflux disease)    Surgical History:  Past Surgical History  Procedure Laterality Date  . Excision of ganglion cyst    . Dilation and curettage of uterus      MISSED AB  . Umbilical hernia repair      with BTL   Family History:  Family History  Problem Relation Age of Onset  . Breast cancer Mother   . Hypertension Mother   . Osteoporosis Mother   . Cancer Maternal Grandfather     COLON  . Cancer Maternal Aunt     colon  . Cancer  Paternal Aunt     colon  . COPD Father   . Cancer Father     lung cancer   Social History:  History  Substance Use Topics  . Smoking status: Never Smoker   . Smokeless tobacco: Never Used  . Alcohol Use: 2.5 oz/week    5 drink(s) per week     Comment: 1-2 glasses of wine at night    Review of Systems: [X]  = complains of  [ ]  = denies  General: Fatigue [ ]  Fever [ ]  Chills [ ]  Weakness [ ]   Insomnia [ ] Weight change [ ]  Night sweats [ ]   Change in appetite [ ]  Eyes: Redness [ ]  Blurred vision [ ]  Diplopia [ ]  Discharge [ ]   ENT: Congestion [ ]  Sinus Pain [ ]  Post Nasal Drip [ ]  Sore Throat [ ]  Earache [ ]   hearing loss [ ]  Tinnitus [ ]  Snoring [ ]   Cardiac: Chest pain/pressure [ ]  SOB [ ]  Orthopnea [ ]   Palpitations [ ]   Paroxysmal nocturnal dyspnea[ ]  Claudication [ ]  Edema [ ]   Pulmonary: Cough [ ]  Wheezing[ ]   SOB [ ]   Pleurisy [ ]   GI: Nausea [ ]  Vomiting[ ]  Dysphagia[ ]  Heartburn[ ]  Abdominal pain [ ]  Constipation [ ] ; Diarrhea [ ]  BRBPR [ ]  Melena[ ]  Bloating [ ]  Hemorrhoids [ ]   GU: Hematuria[ ]  Dysuria [ ]  Nocturia[ ]  Urgency [ ]   Hesitancy [ ]  Discharge [ ]  Frequency [ ]   Breast:  Breast lumps [ ]   nipple discharge [ ]    Neuro: Headaches[ ]  Vertigo[ ]  Paresthesias[ ]  Spasm [ ]  Speech changes [ ]  Incoordination [ ]   Ortho: Arthritis [ ]  Joint pain [ ]  Muscle pain [ ]  Joint swelling [ ]  Back Pain [ ]  Skin:  Rash [ ]   Pruritis [ ]  Change in skin lesion [ ]   Psych: Depression[ ]  Anxiety[ ]  Confusion [ ]  Memory loss [ ]   Heme/Lypmh: Bleeding [ ]  Bruising [ ]  Enlarged lymph nodes [ ]   Endocrine: Visual blurring [ ]  Paresthesia [ ]  Polyuria [ ]  Polydypsea [ ]    Heat/cold intolerance [ ]  Hypoglycemia [ ]   Physical Exam: Estimated body mass index is 18.87 kg/(m^2) as calculated from the following:   Height as of this encounter: 5\' 4"  (1.626 m).   Weight as of this encounter: 110 lb (49.896 kg). BP 110/62  Pulse 68  Temp(Src) 98.6 F (37 C)  Resp 16  Ht 5\' 4"  (1.626 m)  Wt 110 lb (49.896 kg)  BMI 18.87 kg/m2  LMP 01/01/2005 General Appearance: Well nourished, in no apparent distress. Eyes: PERRLA, EOMs, conjunctiva no swelling or erythema, normal fundi and vessels. Sinuses: No Frontal/maxillary tenderness ENT/Mouth: Ext aud canals clear, normal light reflex with TMs without erythema, bulging.  Good dentition. No erythema, swelling, or exudate on post pharynx. Tonsils not swollen or erythematous. Hearing normal.  Neck: Supple, thyroid normal. No bruits Respiratory: Respiratory effort normal, BS equal bilaterally without rales, rhonchi, wheezing or stridor. Cardio: RRR without murmurs,  rubs or gallops. Brisk peripheral pulses without edema.  Chest: symmetric, with normal excursions and percussion. Breasts: Dr. Sabra Heck Abdomen: Soft, +BS. Non tender, no guarding, rebound, hernias, masses, or organomegaly. .  Lymphatics: Non tender without lymphadenopathy.  Genitourinary: defer Dr. Sabra Heck Musculoskeletal: Full ROM all peripheral extremities,5/5 strength, and normal gait. Skin: Warm, dry without rashes, lesions, ecchymosis.  Neuro: Cranial nerves intact, reflexes equal bilaterally. Normal muscle tone, no cerebellar symptoms. Sensation intact.  Psych: Awake and oriented X 3, normal affect, Insight and Judgment appropriate.   EKG: defer   Vicie Mutters 10:20 AM

## 2013-07-14 NOTE — Patient Instructions (Addendum)
Suggest getting 3D Mammogram  Colonoscopy next year  Preventative Care for Adults - Female      MAINTAIN REGULAR HEALTH EXAMS:  A routine yearly physical is a good way to check in with your primary care provider about your health and preventive screening. It is also an opportunity to share updates about your health and any concerns you have, and receive a thorough all-over exam.   Most health insurance companies pay for at least some preventative services.  Check with your health plan for specific coverages.  WHAT PREVENTATIVE SERVICES DO WOMEN NEED?  Adult women should have their weight and blood pressure checked regularly.   Women age 31 and older should have their cholesterol levels checked regularly.  Women should be screened for cervical cancer with a Pap smear and pelvic exam beginning at either age 54, or 3 years after they become sexually activity.    Breast cancer screening generally begins at age 44 with a mammogram and breast exam by your primary care provider.    Beginning at age 13 and continuing to age 41, women should be screened for colorectal cancer.  Certain people may need continued testing until age 33.  Updating vaccinations is part of preventative care.  Vaccinations help protect against diseases such as the flu.  Osteoporosis is a disease in which the bones lose minerals and strength as we age. Women ages 15 and over should discuss this with their caregivers, as should women after menopause who have other risk factors.  Lab tests are generally done as part of preventative care to screen for anemia and blood disorders, to screen for problems with the kidneys and liver, to screen for bladder problems, to check blood sugar, and to check your cholesterol level.  Preventative services generally include counseling about diet, exercise, avoiding tobacco, drugs, excessive alcohol consumption, and sexually transmitted infections.    GENERAL RECOMMENDATIONS FOR GOOD  HEALTH:  Healthy diet:  Eat a variety of foods, including fruit, vegetables, animal or vegetable protein, such as meat, fish, chicken, and eggs, or beans, lentils, tofu, and grains, such as rice.  Drink plenty of water daily.  Decrease saturated fat in the diet, avoid lots of red meat, processed foods, sweets, fast foods, and fried foods.  Exercise:  Aerobic exercise helps maintain good heart health. At least 30-40 minutes of moderate-intensity exercise is recommended. For example, a brisk walk that increases your heart rate and breathing. This should be done on most days of the week.   Find a type of exercise or a variety of exercises that you enjoy so that it becomes a part of your daily life.  Examples are running, walking, swimming, water aerobics, and biking.  For motivation and support, explore group exercise such as aerobic class, spin class, Zumba, Yoga,or  martial arts, etc.    Set exercise goals for yourself, such as a certain weight goal, walk or run in a race such as a 5k walk/run.  Speak to your primary care provider about exercise goals.  Disease prevention:  If you smoke or chew tobacco, find out from your caregiver how to quit. It can literally save your life, no matter how long you have been a tobacco user. If you do not use tobacco, never begin.   Maintain a healthy diet and normal weight. Increased weight leads to problems with blood pressure and diabetes.   The Body Mass Index or BMI is a way of measuring how much of your body is fat. Having a  BMI above 27 increases the risk of heart disease, diabetes, hypertension, stroke and other problems related to obesity. Your caregiver can help determine your BMI and based on it develop an exercise and dietary program to help you achieve or maintain this important measurement at a healthful level.  High blood pressure causes heart and blood vessel problems.  Persistent high blood pressure should be treated with medicine if weight  loss and exercise do not work.   Fat and cholesterol leaves deposits in your arteries that can block them. This causes heart disease and vessel disease elsewhere in your body.  If your cholesterol is found to be high, or if you have heart disease or certain other medical conditions, then you may need to have your cholesterol monitored frequently and be treated with medication.   Ask if you should have a cardiac stress test if your history suggests this. A stress test is a test done on a treadmill that looks for heart disease. This test can find disease prior to there being a problem.  Menopause can be associated with physical symptoms and risks. Hormone replacement therapy is available to decrease these. You should talk to your caregiver about whether starting or continuing to take hormones is right for you.   Osteoporosis is a disease in which the bones lose minerals and strength as we age. This can result in serious bone fractures. Risk of osteoporosis can be identified using a bone density scan. Women ages 53 and over should discuss this with their caregivers, as should women after menopause who have other risk factors. Ask your caregiver whether you should be taking a calcium supplement and Vitamin D, to reduce the rate of osteoporosis.   Avoid drinking alcohol in excess (more than two drinks per day).  Avoid use of street drugs. Do not share needles with anyone. Ask for professional help if you need assistance or instructions on stopping the use of alcohol, cigarettes, and/or drugs.  Brush your teeth twice a day with fluoride toothpaste, and floss once a day. Good oral hygiene prevents tooth decay and gum disease. The problems can be painful, unattractive, and can cause other health problems. Visit your dentist for a routine oral and dental check up and preventive care every 6-12 months.   Look at your skin regularly.  Use a mirror to look at your back. Notify your caregivers of changes in moles,  especially if there are changes in shapes, colors, a size larger than a pencil eraser, an irregular border, or development of new moles.  Safety:  Use seatbelts 100% of the time, whether driving or as a passenger.  Use safety devices such as hearing protection if you work in environments with loud noise or significant background noise.  Use safety glasses when doing any work that could send debris in to the eyes.  Use a helmet if you ride a bike or motorcycle.  Use appropriate safety gear for contact sports.  Talk to your caregiver about gun safety.  Use sunscreen with a SPF (or skin protection factor) of 15 or greater.  Lighter skinned people are at a greater risk of skin cancer. Don't forget to also wear sunglasses in order to protect your eyes from too much damaging sunlight. Damaging sunlight can accelerate cataract formation.   Practice safe sex. Use condoms. Condoms are used for birth control and to help reduce the spread of sexually transmitted infections (or STIs).  Some of the STIs are gonorrhea (the clap), chlamydia, syphilis, trichomonas, herpes,  HPV (human papilloma virus) and HIV (human immunodeficiency virus) which causes AIDS. The herpes, HIV and HPV are viral illnesses that have no cure. These can result in disability, cancer and death.   Keep carbon monoxide and smoke detectors in your home functioning at all times. Change the batteries every 6 months or use a model that plugs into the wall.   Vaccinations:  Stay up to date with your tetanus shots and other required immunizations. You should have a booster for tetanus every 10 years. Be sure to get your flu shot every year, since 5%-20% of the [place] population comes down with the flu. The flu vaccine changes each year, so being vaccinated once is not enough. Get your shot in the fall, before the flu season peaks.   Other vaccines to consider:  Human Papilloma Virus or HPV causes cancer of the cervix, and other infections that can be  transmitted from person to person. There is a vaccine for HPV, and females should get immunized between the ages of 26 and 64. It requires a series of 3 shots.   Pneumococcal vaccine to protect against certain types of pneumonia.  This is normally recommended for adults age 82 or older.  However, adults younger than 58 years old with certain underlying conditions such as diabetes, heart or lung disease should also receive the vaccine.  Shingles vaccine to protect against Varicella Zoster if you are older than age 77, or younger than 57 years old with certain underlying illness.  Hepatitis A vaccine to protect against a form of infection of the liver by a virus acquired from food.  Hepatitis B vaccine to protect against a form of infection of the liver by a virus acquired from blood or body fluids, particularly if you work in health care.  If you plan to travel internationally, check with your local health department for specific vaccination recommendations.  Cancer Screening:  Breast cancer screening is essential to preventive care for women. All women age 72 and older should perform a breast self-exam every month. At age 4 and older, women should have their caregiver complete a breast exam each year. Women at ages 33 and older should have a mammogram (x-ray film) of the breasts. Your caregiver can discuss how often you need mammograms.    Cervical cancer screening includes taking a Pap smear (sample of cells examined under a microscope) from the cervix (end of the uterus). It also includes testing for HPV (Human Papilloma Virus, which can cause cervical cancer). Screening and a pelvic exam should begin at age 50, or 3 years after a woman becomes sexually active. Screening should occur every year, with a Pap smear but no HPV testing, up to age 70. After age 54, you should have a Pap smear every 3 years with HPV testing, if no HPV was found previously.   Most routine colon cancer screening begins  at the age of 49. On a yearly basis, doctors may provide special easy to use take-home tests to check for hidden blood in the stool. Sigmoidoscopy or colonoscopy can detect the earliest forms of colon cancer and is life saving. These tests use a small camera at the end of a tube to directly examine the colon. Speak to your caregiver about this at age 79, when routine screening begins (and is repeated every 5 years unless early forms of pre-cancerous polyps or small growths are found).

## 2013-07-15 LAB — URINALYSIS, ROUTINE W REFLEX MICROSCOPIC
Bilirubin Urine: NEGATIVE
Glucose, UA: NEGATIVE mg/dL
Hgb urine dipstick: NEGATIVE
Ketones, ur: NEGATIVE mg/dL
Leukocytes, UA: NEGATIVE
Nitrite: NEGATIVE
Protein, ur: NEGATIVE mg/dL
Specific Gravity, Urine: 1.009 (ref 1.005–1.030)
Urobilinogen, UA: 0.2 mg/dL (ref 0.0–1.0)
pH: 8 (ref 5.0–8.0)

## 2013-07-15 LAB — IRON AND TIBC
%SAT: 18 % — ABNORMAL LOW (ref 20–55)
Iron: 62 ug/dL (ref 42–145)
TIBC: 350 ug/dL (ref 250–470)
UIBC: 288 ug/dL (ref 125–400)

## 2013-07-15 LAB — HEPATIC FUNCTION PANEL
ALT: 15 U/L (ref 0–35)
AST: 16 U/L (ref 0–37)
Albumin: 4.8 g/dL (ref 3.5–5.2)
Alkaline Phosphatase: 36 U/L — ABNORMAL LOW (ref 39–117)
Bilirubin, Direct: 0.1 mg/dL (ref 0.0–0.3)
Indirect Bilirubin: 0.4 mg/dL (ref 0.2–1.2)
Total Bilirubin: 0.5 mg/dL (ref 0.2–1.2)
Total Protein: 6.7 g/dL (ref 6.0–8.3)

## 2013-07-15 LAB — BASIC METABOLIC PANEL WITH GFR
BUN: 8 mg/dL (ref 6–23)
CO2: 29 mEq/L (ref 19–32)
Calcium: 9.5 mg/dL (ref 8.4–10.5)
Chloride: 106 mEq/L (ref 96–112)
Creat: 0.66 mg/dL (ref 0.50–1.10)
GFR, Est African American: >89 mL/min
GFR, Est Non African American: >89 mL/min
Glucose, Bld: 93 mg/dL (ref 70–99)
Potassium: 4.4 mEq/L (ref 3.5–5.3)
Sodium: 143 mEq/L (ref 135–145)

## 2013-07-15 LAB — VITAMIN D 25 HYDROXY (VIT D DEFICIENCY, FRACTURES): Vit D, 25-Hydroxy: 81 ng/mL (ref 30–89)

## 2013-07-15 LAB — MICROALBUMIN / CREATININE URINE RATIO
Creatinine, Urine: 26.4 mg/dL
Microalb Creat Ratio: 18.9 mg/g (ref 0.0–30.0)
Microalb, Ur: 0.5 mg/dL (ref 0.00–1.89)

## 2013-07-15 LAB — INSULIN, FASTING: Insulin fasting, serum: 5 u[IU]/mL (ref 3–28)

## 2013-07-15 LAB — TSH: TSH: 1.805 u[IU]/mL (ref 0.350–4.500)

## 2013-07-15 LAB — MAGNESIUM: Magnesium: 2.1 mg/dL (ref 1.5–2.5)

## 2013-07-15 LAB — VITAMIN B12: Vitamin B-12: 713 pg/mL (ref 211–911)

## 2013-08-03 ENCOUNTER — Encounter: Payer: Self-pay | Admitting: Gastroenterology

## 2013-08-20 ENCOUNTER — Other Ambulatory Visit: Payer: Self-pay

## 2013-08-20 DIAGNOSIS — Z1231 Encounter for screening mammogram for malignant neoplasm of breast: Secondary | ICD-10-CM

## 2013-09-14 ENCOUNTER — Ambulatory Visit
Admission: RE | Admit: 2013-09-14 | Discharge: 2013-09-14 | Disposition: A | Discharge status: Home / Self Care | Payer: BC Managed Care – PPO | Source: Ambulatory Visit

## 2013-09-14 DIAGNOSIS — Z1231 Encounter for screening mammogram for malignant neoplasm of breast: Secondary | ICD-10-CM

## 2013-11-02 ENCOUNTER — Encounter: Payer: Self-pay | Admitting: Physician Assistant

## 2013-12-28 ENCOUNTER — Other Ambulatory Visit (INDEPENDENT_AMBULATORY_CARE_PROVIDER_SITE_OTHER): Payer: BC Managed Care – PPO

## 2013-12-28 DIAGNOSIS — Z1211 Encounter for screening for malignant neoplasm of colon: Secondary | ICD-10-CM

## 2013-12-28 LAB — POC HEMOCCULT BLD/STL (HOME/3-CARD/SCREEN)
Card #2 Fecal Occult Blod, POC: NEGATIVE
Card #3 Fecal Occult Blood, POC: NEGATIVE
Fecal Occult Blood, POC: NEGATIVE

## 2014-01-01 HISTORY — PX: BELPHAROPTOSIS REPAIR: SHX369

## 2014-06-28 ENCOUNTER — Ambulatory Visit: Payer: BC Managed Care – PPO | Admitting: Obstetrics & Gynecology

## 2014-07-09 ENCOUNTER — Telehealth: Payer: Self-pay | Admitting: Internal Medicine

## 2014-07-09 NOTE — Telephone Encounter (Signed)
Pt states she has had IBS for years and has been having diarrhea now for a while. Would like to see if xifaxan is an option for her. Pt scheduled to see Dr. Carlean Purl to discuss IBS and ?xifaxan. Pt scheduled for 7/27@9 :15am. Pt aware of appt.

## 2014-07-15 ENCOUNTER — Encounter: Payer: Self-pay | Admitting: Physician Assistant

## 2014-07-15 ENCOUNTER — Ambulatory Visit (INDEPENDENT_AMBULATORY_CARE_PROVIDER_SITE_OTHER): Payer: BLUE CROSS/BLUE SHIELD | Admitting: Physician Assistant

## 2014-07-15 VITALS — BP 120/78 | HR 72 | Temp 97.7°F | Resp 16 | Ht 64.0 in | Wt 110.0 lb

## 2014-07-15 DIAGNOSIS — R002 Palpitations: Secondary | ICD-10-CM

## 2014-07-15 DIAGNOSIS — E785 Hyperlipidemia, unspecified: Secondary | ICD-10-CM

## 2014-07-15 DIAGNOSIS — D509 Iron deficiency anemia, unspecified: Secondary | ICD-10-CM

## 2014-07-15 DIAGNOSIS — N6019 Diffuse cystic mastopathy of unspecified breast: Secondary | ICD-10-CM

## 2014-07-15 DIAGNOSIS — G4700 Insomnia, unspecified: Secondary | ICD-10-CM

## 2014-07-15 DIAGNOSIS — M8580 Other specified disorders of bone density and structure, unspecified site: Secondary | ICD-10-CM

## 2014-07-15 DIAGNOSIS — Z0000 Encounter for general adult medical examination without abnormal findings: Secondary | ICD-10-CM | POA: Diagnosis not present

## 2014-07-15 DIAGNOSIS — I10 Essential (primary) hypertension: Secondary | ICD-10-CM

## 2014-07-15 DIAGNOSIS — N951 Menopausal and female climacteric states: Secondary | ICD-10-CM

## 2014-07-15 DIAGNOSIS — D126 Benign neoplasm of colon, unspecified: Secondary | ICD-10-CM

## 2014-07-15 DIAGNOSIS — K2980 Duodenitis without bleeding: Secondary | ICD-10-CM

## 2014-07-15 DIAGNOSIS — Z79899 Other long term (current) drug therapy: Secondary | ICD-10-CM

## 2014-07-15 DIAGNOSIS — K589 Irritable bowel syndrome without diarrhea: Secondary | ICD-10-CM

## 2014-07-15 DIAGNOSIS — K648 Other hemorrhoids: Secondary | ICD-10-CM

## 2014-07-15 DIAGNOSIS — E559 Vitamin D deficiency, unspecified: Secondary | ICD-10-CM

## 2014-07-15 DIAGNOSIS — Z1159 Encounter for screening for other viral diseases: Secondary | ICD-10-CM

## 2014-07-15 DIAGNOSIS — K2100 Gastro-esophageal reflux disease with esophagitis, without bleeding: Secondary | ICD-10-CM

## 2014-07-15 LAB — TSH: TSH: 1.421 u[IU]/mL (ref 0.350–4.500)

## 2014-07-15 LAB — CBC WITH DIFFERENTIAL/PLATELET
Basophils Absolute: 0.1 10*3/uL (ref 0.0–0.1)
Basophils Relative: 1 % (ref 0–1)
Eosinophils Absolute: 0.1 10*3/uL (ref 0.0–0.7)
Eosinophils Relative: 2 % (ref 0–5)
HCT: 44.4 % (ref 36.0–46.0)
Hemoglobin: 15 g/dL (ref 12.0–15.0)
Lymphocytes Relative: 31 % (ref 12–46)
Lymphs Abs: 2.2 10*3/uL (ref 0.7–4.0)
MCH: 32.6 pg (ref 26.0–34.0)
MCHC: 33.8 g/dL (ref 30.0–36.0)
MCV: 96.5 fL (ref 78.0–100.0)
MPV: 8.7 fL (ref 8.6–12.4)
Monocytes Absolute: 0.3 10*3/uL (ref 0.1–1.0)
Monocytes Relative: 4 % (ref 3–12)
Neutro Abs: 4.5 10*3/uL (ref 1.7–7.7)
Neutrophils Relative %: 62 % (ref 43–77)
Platelets: 284 10*3/uL (ref 150–400)
RBC: 4.6 MIL/uL (ref 3.87–5.11)
RDW: 13.6 % (ref 11.5–15.5)
WBC: 7.2 10*3/uL (ref 4.0–10.5)

## 2014-07-15 LAB — BASIC METABOLIC PANEL WITH GFR
BUN: 13 mg/dL (ref 6–23)
CO2: 27 mEq/L (ref 19–32)
Calcium: 9.7 mg/dL (ref 8.4–10.5)
Chloride: 103 mEq/L (ref 96–112)
Creat: 0.8 mg/dL (ref 0.50–1.10)
GFR, Est African American: >89 mL/min
GFR, Est Non African American: 82 mL/min
Glucose, Bld: 86 mg/dL (ref 70–99)
Potassium: 4.1 mEq/L (ref 3.5–5.3)
Sodium: 141 mEq/L (ref 135–145)

## 2014-07-15 LAB — IRON AND TIBC
%SAT: 23 % (ref 20–55)
Iron: 88 ug/dL (ref 42–145)
TIBC: 377 ug/dL (ref 250–470)
UIBC: 289 ug/dL (ref 125–400)

## 2014-07-15 LAB — LIPID PANEL
Cholesterol: 207 mg/dL — ABNORMAL HIGH (ref 0–200)
HDL: 101 mg/dL (ref >=46)
LDL Cholesterol: 98 mg/dL (ref 0–99)
Total CHOL/HDL Ratio: 2 Ratio
Triglycerides: 41 mg/dL (ref <150)
VLDL: 8 mg/dL (ref 0–40)

## 2014-07-15 LAB — HEPATIC FUNCTION PANEL
ALT: 15 U/L (ref 0–35)
AST: 19 U/L (ref 0–37)
Albumin: 4.5 g/dL (ref 3.5–5.2)
Alkaline Phosphatase: 32 U/L — ABNORMAL LOW (ref 39–117)
Bilirubin, Direct: 0.1 mg/dL (ref 0.0–0.3)
Indirect Bilirubin: 0.5 mg/dL (ref 0.2–1.2)
Total Bilirubin: 0.6 mg/dL (ref 0.2–1.2)
Total Protein: 7 g/dL (ref 6.0–8.3)

## 2014-07-15 LAB — FERRITIN: Ferritin: 26 ng/mL (ref 10–291)

## 2014-07-15 LAB — MAGNESIUM: Magnesium: 2.1 mg/dL (ref 1.5–2.5)

## 2014-07-15 NOTE — Progress Notes (Signed)
Complete Physical  Assessment and Plan: Osteopenia- continue estrogen, Vitamin D, and weight bearing exercises  Menopausal symptom- continue estrogen  Hemorrhoids- monitored  Anemia- check labs  IBS (irritable bowel syndrome)-controlled  Insomnia- controlled, no AEs on ambien  Hyperlipidemia--continue medications, check lipids, decrease fatty foods, increase activity.   GERD (gastroesophageal reflux disease)-controlled with occ prevacid  Dense breast: get 3D MGM Palpitations- avoid triggers such as alcohol, smoking, caffeine etc, Valsalva taught, call if worsening palpations, dizziness, CP, SOB.   Discussed med's effects and SE's. Screening labs and tests as requested with regular follow-up as recommended.  HPI 59 y.o. female  presents for a complete physical.  Her blood pressure has been controlled at home, today their BP is BP: 120/78 mmHg She does workout, walks. She denies chest pain, shortness of breath, dizziness.  She is on cholesterol medication and denies myalgias. Her cholesterol is at goal. The cholesterol last visit was:  LDL 127 (107)  Last A1C in the office was: 5.6 She is on minivelle and bASA, will get MGM Patient is on Vitamin D supplement.   Flutters were better with zegrid.  Will get palpitations, intermittent, several times a week, lasts for seconds, no accompaniments, never with exercise, will take a deep breath with it that helps, worse with stress. Has some anxiety, rarely takes xanax.   Current Medications:  Current Outpatient Prescriptions on File Prior to Visit  Medication Sig Dispense Refill  . ALPRAZolam (XANAX PO) Take by mouth.    Marland Kitchen BABY ASPIRIN PO Take 81 mg by mouth daily.    . Calcium Carbonate-Vitamin D (CALCIUM + D PO) Take by mouth.    . estradiol-norethindrone (ACTIVELLA) 1-0.5 MG per tablet Take 1 tablet by mouth daily. 90 tablet 4  . fish oil-omega-3 fatty acids 1000 MG capsule Take 2 g by mouth daily.    Marland Kitchen Hyoscyamine Sulfate (LEVSIN PO)  Take by mouth as needed.     . Multiple Vitamin (MULTIVITAMIN) capsule Take 1 capsule by mouth daily.    Earney Navy Bicarbonate (ZEGERID OTC PO) Take by mouth.    . Zolpidem Tartrate (AMBIEN PO) Take by mouth.     No current facility-administered medications on file prior to visit.   Health Maintenance:   Immunization History  Administered Date(s) Administered  . Pneumococcal Polysaccharide-23 01/02/2003  . Td 04/15/2003  . Tdap 07/14/2013   Tetanus: 2015 Pneumovax: 1997 Prevnar 13 due at 65 Flu vaccine: N/A Zostavax: N/A Pap: 2015 at OB/GYN 3D MGM: 09/2013 CAT C DEXA: normal 2013 on estrogen, works out, vitamin D at goal, will wait 1-2 more years per patient request Colonoscopy: 12/2009 due 2016, has appointment.  EGD: 2006  Echo 2008 EF 60%, normal aorta 2013 MRI lumbar 2009  [facility] Dr. Sabra Heck, appointment today Dentist  Dr. Sigmund Hazel q 6 months Patient Care Team: Unk Pinto, MD as PCP - General (Internal Medicine) Viona Gilmore Sallye Lat, MD as Consulting Physician (Plastic Surgery) Gatha Mayer, MD as Consulting Physician (Gastroenterology) Megan Salon, MD as Consulting Physician (Gynecology) Rolm Bookbinder, MD as Consulting Physician (Dermatology)   Allergies:  Allergies  Allergen Reactions  . Sulfonamide Derivatives     REACTION: Patient unsure of reaction, states mother advised her it was a childhood allergy   Medical History:  Past Medical History  Diagnosis Date  . osteopenia   . Menopausal symptom   . Hemorrhoids   . Anemia   . IBS (irritable bowel syndrome)   . Insomnia   . Hyperlipidemia   .  GERD (gastroesophageal reflux disease)   . Esophagitis   . Adenomatous colon polyp 12/12/2004  . Duodenitis    Surgical History:  Past Surgical History  Procedure Laterality Date  . Excision of ganglion cyst    . Dilation and curettage of uterus      MISSED AB  . Umbilical hernia repair      with BTL  . Colonoscopy w/ biopsies    .  Esophagogastroduodenoscopy    . Flexible sigmoidoscopy     Family History:  Family History  Problem Relation Age of Onset  . Breast cancer Mother   . Hypertension Mother   . Osteoporosis Mother   . Colon cancer Maternal Grandfather   . Colon cancer Maternal Aunt   . Colon cancer Paternal Aunt   . COPD Father   . Lung cancer Father    Social History:  History  Substance Use Topics  . Smoking status: Never Smoker   . Smokeless tobacco: Never Used  . Alcohol Use: 2.5 oz/week    5 drink(s) per week     Comment: 1-2 glasses of wine at night   Review of Systems  Constitutional: Negative.   HENT: Negative.   Eyes: Negative.   Respiratory: Negative.   Cardiovascular: Positive for palpitations (intermittent, last seconds, no accompaniments, and nonexertional). Negative for chest pain, orthopnea, claudication, leg swelling and PND.  Gastrointestinal: Negative.   Genitourinary: Negative.   Musculoskeletal: Negative.   Skin: Positive for rash. Negative for itching.  Neurological: Negative.   Psychiatric/Behavioral: Negative.     Physical Exam: Estimated body mass index is 18.87 kg/(m^2) as calculated from the following:   Height as of this encounter: 5\' 4"  (1.626 m).   Weight as of this encounter: 110 lb (49.896 kg). BP 120/78 mmHg  Pulse 72  Temp(Src) 97.7 F (36.5 C)  Resp 16  Ht 5\' 4"  (1.626 m)  Wt 110 lb (49.896 kg)  BMI 18.87 kg/m2  LMP 01/01/2005 General Appearance: Well nourished, in no apparent distress. Eyes: PERRLA, EOMs, conjunctiva no swelling or erythema, normal fundi and vessels. Sinuses: No Frontal/maxillary tenderness ENT/Mouth: Ext aud canals clear, normal light reflex with TMs without erythema, bulging.  Good dentition. No erythema, swelling, or exudate on post pharynx. Tonsils not swollen or erythematous. Hearing normal.  Neck: Supple, thyroid normal. No bruits Respiratory: Respiratory effort normal, BS equal bilaterally without rales, rhonchi, wheezing  or stridor. Cardio: RRR without murmurs, rubs or gallops. Brisk peripheral pulses without edema.  Chest: symmetric, with normal excursions and percussion. Breasts: Dr. Sabra Heck Abdomen: Soft, +BS. Non tender, no guarding, rebound, hernias, masses, or organomegaly. .  Lymphatics: Non tender without lymphadenopathy.  Genitourinary: defer Dr. Sabra Heck Musculoskeletal: Full ROM all peripheral extremities,5/5 strength, and normal gait. Skin: Warm, dry without rashes, lesions, ecchymosis.  Neuro: Cranial nerves intact, reflexes equal bilaterally. Normal muscle tone, no cerebellar symptoms. Sensation intact.  Psych: Awake and oriented X 3, normal affect, Insight and Judgment appropriate.   EKG: normal, LAE, no changes   Vicie Mutters 10:38 AM

## 2014-07-15 NOTE — Patient Instructions (Addendum)
Palpitations A palpitation is the feeling that your heartbeat is irregular or is faster than normal. It may feel like your heart is fluttering or skipping a beat. Palpitations are usually not a serious problem. However, in some cases, you may need further medical evaluation. CAUSES  Palpitations can be caused by:  Smoking.  Caffeine or other stimulants, such as diet pills or energy drinks.  Alcohol.  Stress and anxiety.  Strenuous physical activity.  Fatigue.  Certain medicines.  Heart disease, especially if you have a history of irregular heart rhythms (arrhythmias), such as atrial fibrillation, atrial flutter, or supraventricular tachycardia.  An improperly working pacemaker or defibrillator. DIAGNOSIS  To find the cause of your palpitations, your health care provider will take your medical history and perform a physical exam. Your health care provider may also have you take a test called an ambulatory electrocardiogram (ECG). An ECG records your heartbeat patterns over a 24-hour period. You may also have other tests, such as:  Transthoracic echocardiogram (TTE). During echocardiography, sound waves are used to evaluate how blood flows through your heart.  Transesophageal echocardiogram (TEE).  Cardiac monitoring. This allows your health care provider to monitor your heart rate and rhythm in real time.  Holter monitor. This is a portable device that records your heartbeat and can help diagnose heart arrhythmias. It allows your health care provider to track your heart activity for several days, if needed.  Stress tests by exercise or by giving medicine that makes the heart beat faster. TREATMENT  Treatment of palpitations depends on the cause of your symptoms and can vary greatly. Most cases of palpitations do not require any treatment other than time, relaxation, and monitoring your symptoms. Other causes, such as atrial fibrillation, atrial flutter, or supraventricular  tachycardia, usually require further treatment. HOME CARE INSTRUCTIONS   Avoid:  Caffeinated coffee, tea, soft drinks, diet pills, and energy drinks.  Chocolate.  Alcohol.  Stop smoking if you smoke.  Reduce your stress and anxiety. Things that can help you relax include:  A method of controlling things in your body, such as your heartbeats, with your mind (biofeedback).  Yoga.  Meditation.  Physical activity such as swimming, jogging, or walking.  Get plenty of rest and sleep. SEEK MEDICAL CARE IF:   You continue to have a fast or irregular heartbeat beyond 24 hours.  Your palpitations occur more often. SEEK IMMEDIATE MEDICAL CARE IF:  You have chest pain or shortness of breath.  You have a severe headache.  You feel dizzy or you faint. MAKE SURE YOU:  Understand these instructions.  Will watch your condition.  Will get help right away if you are not doing well or get worse. Document Released: 12/16/1999 Document Revised: 12/23/2012 Document Reviewed: 02/16/2011 [facility] Patient Information 2015 [place], [place]. This information is not intended to replace advice given to you by your health care provider. Make sure you discuss any questions you have with your health care provider.  Marland KitchenGETTING OFF OF PPI's    Nexium/protonix/prilosec/Omeprazole/Dexilant/Aciphex are called PPI's, they are great at healing your stomach but should only be taken for a short period of time.     Recent studies have shown that taken for a long time they  can increase the risk of osteoporosis (weakening of your bones), pneumonia, low magnesium, restless legs, Cdiff (infection that causes diarrhea), DEMENTIA and most recently kidney damage / disease / insufficiency.     Due to this information we want to try to stop the PPI but if  you try to stop it abruptly this can cause rebound acid and worsening symptoms.   Can do zantac/pepcid generic, one pill at night.   - Avoid alcohol, spicy  foods, NSAIDS (aleve, ibuprofen) at this time. See foods below.   +++++++++++++++++++++++++++++++++++++++++++  Food Choices for Gastroesophageal Reflux Disease  When you have gastroesophageal reflux disease (GERD), the foods you eat and your eating habits are very important. Choosing the right foods can help ease the discomfort of GERD. WHAT GENERAL GUIDELINES DO I NEED TO FOLLOW?  Choose fruits, vegetables, whole grains, low-fat dairy products, and low-fat meat, fish, and poultry.  Limit fats such as oils, salad dressings, butter, nuts, and avocado.  Keep a food diary to identify foods that cause symptoms.  Avoid foods that cause reflux. These may be different for different people.  Eat frequent small meals instead of three large meals each day.  Eat your meals slowly, in a relaxed setting.  Limit fried foods.  Cook foods using methods other than frying.  Avoid drinking alcohol.  Avoid drinking large amounts of liquids with your meals.  Avoid bending over or lying down until 2-3 hours after eating.   WHAT FOODS ARE NOT RECOMMENDED? The following are some foods and drinks that may worsen your symptoms:  Vegetables Tomatoes. Tomato juice. Tomato and spaghetti sauce. Chili peppers. Onion and garlic. Horseradish. Fruits Oranges, grapefruit, and lemon (fruit and juice). Meats High-fat meats, fish, and poultry. This includes hot dogs, ribs, ham, sausage, salami, and bacon. Dairy Whole milk and chocolate milk. Sour cream. Cream. Butter. Ice cream. Cream cheese.  Beverages Coffee and tea, with or without caffeine. Carbonated beverages or energy drinks. Condiments Hot sauce. Barbecue sauce.  Sweets/Desserts Chocolate and cocoa. Donuts. Peppermint and spearmint. Fats and Oils High-fat foods, including [place] fries and potato chips. Other Vinegar. Strong spices, such as black pepper, white pepper, red pepper, cayenne, curry powder, cloves, ginger, and chili  powder. Nexium/protonix/prilosec are called PPI's, they are great at healing your stomach but should only be taken for a short period of time.   Encourage you to get the 3D Mammogram  The 3D Mammogram is much more specific and sensitive to pick up breast cancer. For women with fibrocystic breast or lumpy breast it can be hard to determine if it is cancer or not but the 3D mammogram is able to tell this difference which cuts back on unneeded additional tests or scary call backs.   Preventive Care for Adults  A healthy lifestyle and preventive care can promote health and wellness. Preventive health guidelines for women include the following key practices.  A routine yearly physical is a good way to check with your health care provider about your health and preventive screening. It is a chance to share any concerns and updates on your health and to receive a thorough exam.  Visit your dentist for a routine exam and preventive care every 6 months. Brush your teeth twice a day and floss once a day. Good oral hygiene prevents tooth decay and gum disease.  The frequency of eye exams is based on your age, health, family medical history, use of contact lenses, and other factors. Follow your health care provider's recommendations for frequency of eye exams.  Eat a healthy diet. Foods like vegetables, fruits, whole grains, low-fat dairy products, and lean protein foods contain the nutrients you need without too many calories. Decrease your intake of foods high in solid fats, added sugars, and salt. Eat the right amount of calories  for you.Get information about a proper diet from your health care provider, if necessary.  Regular physical exercise is one of the most important things you can do for your health. Most adults should get at least 150 minutes of moderate-intensity exercise (any activity that increases your heart rate and causes you to sweat) each week. In addition, most adults need  muscle-strengthening exercises on 2 or more days a week.  Maintain a healthy weight. The body mass index (BMI) is a screening tool to identify possible weight problems. It provides an estimate of body fat based on height and weight. Your health care provider can find your BMI and can help you achieve or maintain a healthy weight.For adults 20 years and older:  A BMI below 18.5 is considered underweight.  A BMI of 18.5 to 24.9 is normal.  A BMI of 25 to 29.9 is considered overweight.  A BMI of 30 and above is considered obese.  Maintain normal blood lipids and cholesterol levels by exercising and minimizing your intake of saturated fat. Eat a balanced diet with plenty of fruit and vegetables. Blood tests for lipids and cholesterol should begin at age 11 and be repeated every 5 years. If your lipid or cholesterol levels are high, you are over 50, or you are at high risk for heart disease, you may need your cholesterol levels checked more frequently.Ongoing high lipid and cholesterol levels should be treated with medicines if diet and exercise are not working.  If you smoke, find out from your health care provider how to quit. If you do not use tobacco, do not start.  Lung cancer screening is recommended for adults aged 53-80 years who are at high risk for developing lung cancer because of a history of smoking. A yearly low-dose CT scan of the lungs is recommended for people who have at least a 30-pack-year history of smoking and are a current smoker or have quit within the past 15 years. A pack year of smoking is smoking an average of 1 pack of cigarettes a day for 1 year (for example: 1 pack a day for 30 years or 2 packs a day for 15 years). Yearly screening should continue until the smoker has stopped smoking for at least 15 years. Yearly screening should be stopped for people who develop a health problem that would prevent them from having lung cancer treatment.  High blood pressure causes heart  disease and increases the risk of stroke. Your blood pressure should be checked at least every 1 to 2 years. Ongoing high blood pressure should be treated with medicines if weight loss and exercise do not work.  If you are 45-5 years old, ask your health care provider if you should take aspirin to prevent strokes.  Diabetes screening involves taking a blood sample to check your fasting blood sugar level. This should be done once every 3 years, after age 70, if you are within normal weight and without risk factors for diabetes. Testing should be considered at a younger age or be carried out more frequently if you are overweight and have at least 1 risk factor for diabetes.  Breast cancer screening is essential preventive care for women. You should practice "breast self-awareness." This means understanding the normal appearance and feel of your breasts and may include breast self-examination. Any changes detected, no matter how small, should be reported to a health care provider. Women in their 64s and 30s should have a clinical breast exam (CBE) by a health care  provider as part of a regular health exam every 1 to 3 years. After age 11, women should have a CBE every year. Starting at age 77, women should consider having a mammogram (breast X-ray test) every year. Women who have a family history of breast cancer should talk to their health care provider about genetic screening. Women at a high risk of breast cancer should talk to their health care providers about having an MRI and a mammogram every year.  Breast cancer gene (BRCA)-related cancer risk assessment is recommended for women who have family members with BRCA-related cancers. BRCA-related cancers include breast, ovarian, tubal, and peritoneal cancers. Having family members with these cancers may be associated with an increased risk for harmful changes (mutations) in the breast cancer genes BRCA1 and BRCA2. Results of the assessment will determine  the need for genetic counseling and BRCA1 and BRCA2 testing.  Routine pelvic exams to screen for cancer are no longer recommended for nonpregnant women who are considered low risk for cancer of the pelvic organs (ovaries, uterus, and vagina) and who do not have symptoms. Ask your health care provider if a screening pelvic exam is right for you.  If you have had past treatment for cervical cancer or a condition that could lead to cancer, you need Pap tests and screening for cancer for at least 20 years after your treatment. If Pap tests have been discontinued, your risk factors (such as having a new sexual partner) need to be reassessed to determine if screening should be resumed. Some women have medical problems that increase the chance of getting cervical cancer. In these cases, your health care provider may recommend more frequent screening and Pap tests.  Colorectal cancer can be detected and often prevented. Most routine colorectal cancer screening begins at the age of 1 years and continues through age 35 years. However, your health care provider may recommend screening at an earlier age if you have risk factors for colon cancer. On a yearly basis, your health care provider may provide home test kits to check for hidden blood in the stool. Use of a small camera at the end of a tube, to directly examine the colon (sigmoidoscopy or colonoscopy), can detect the earliest forms of colorectal cancer. Talk to your health care provider about this at age 53, when routine screening begins. Direct exam of the colon should be repeated every 5-10 years through age 36 years, unless early forms of pre-cancerous polyps or small growths are found.  Hepatitis C blood testing is recommended for all people born from [birth year] through 1965 and any individual with known risks for hepatitis C.  Pra  Osteoporosis is a disease in which the bones lose minerals and strength with aging. This can result in serious bone fractures or  breaks. The risk of osteoporosis can be identified using a bone density scan. Women ages 79 years and over and women at risk for fractures or osteoporosis should discuss screening with their health care providers. Ask your health care provider whether you should take a calcium supplement or vitamin D to reduce the rate of osteoporosis.  Menopause can be associated with physical symptoms and risks. Hormone replacement therapy is available to decrease symptoms and risks. You should talk to your health care provider about whether hormone replacement therapy is right for you.  Use sunscreen. Apply sunscreen liberally and repeatedly throughout the day. You should seek shade when your shadow is shorter than you. Protect yourself by wearing long sleeves, pants, a wide-brimmed  hat, and sunglasses year round, whenever you are outdoors.  Once a month, do a whole body skin exam, using a mirror to look at the skin on your back. Tell your health care provider of new moles, moles that have irregular borders, moles that are larger than a pencil eraser, or moles that have changed in shape or color.  Stay current with required vaccines (immunizations).  Influenza vaccine. All adults should be immunized every year.  Tetanus, diphtheria, and acellular pertussis (Td, Tdap) vaccine. Pregnant women should receive 1 dose of Tdap vaccine during each pregnancy. The dose should be obtained regardless of the length of time since the last dose. Immunization is preferred during the 27th-36th week of gestation. An adult who has not previously received Tdap or who does not know her vaccine status should receive 1 dose of Tdap. This initial dose should be followed by tetanus and diphtheria toxoids (Td) booster doses every 10 years. Adults with an unknown or incomplete history of completing a 3-dose immunization series with Td-containing vaccines should begin or complete a primary immunization series including a Tdap dose. Adults should  receive a Td booster every 10 years.  Varicella vaccine. An adult without evidence of immunity to varicella should receive 2 doses or a second dose if she has previously received 1 dose. Pregnant females who do not have evidence of immunity should receive the first dose after pregnancy. This first dose should be obtained before leaving the health care facility. The second dose should be obtained 4-8 weeks after the first dose.  Human papillomavirus (HPV) vaccine. Females aged 13-26 years who have not received the vaccine previously should obtain the 3-dose series. The vaccine is not recommended for use in pregnant females. However, pregnancy testing is not needed before receiving a dose. If a female is found to be pregnant after receiving a dose, no treatment is needed. In that case, the remaining doses should be delayed until after the pregnancy. Immunization is recommended for any person with an immunocompromised condition through the age of 33 years if she did not get any or all doses earlier. During the 3-dose series, the second dose should be obtained 4-8 weeks after the first dose. The third dose should be obtained 24 weeks after the first dose and 16 weeks after the second dose.  Zoster vaccine. One dose is recommended for adults aged 78 years or older unless certain conditions are present.  Measles, mumps, and rubella (MMR) vaccine. Adults born before [birth year] generally are considered immune to measles and mumps. Adults born in [birth year] or later should have 1 or more doses of MMR vaccine unless there is a contraindication to the vaccine or there is laboratory evidence of immunity to each of the three diseases. A routine second dose of MMR vaccine should be obtained at least 28 days after the first dose for students attending postsecondary schools, health care workers, or international travelers. People who received inactivated measles vaccine or an unknown type of measles vaccine during 1963-1967 should  receive 2 doses of MMR vaccine. People who received inactivated mumps vaccine or an unknown type of mumps vaccine before 1979 and are at high risk for mumps infection should consider immunization with 2 doses of MMR vaccine. For females of childbearing age, rubella immunity should be determined. If there is no evidence of immunity, females who are not pregnant should be vaccinated. If there is no evidence of immunity, females who are pregnant should delay immunization until after pregnancy. Unvaccinated health care workers  born before [birth year] who lack laboratory evidence of measles, mumps, or rubella immunity or laboratory confirmation of disease should consider measles and mumps immunization with 2 doses of MMR vaccine or rubella immunization with 1 dose of MMR vaccine.  Pneumococcal 13-valent conjugate (PCV13) vaccine. When indicated, a person who is uncertain of her immunization history and has no record of immunization should receive the PCV13 vaccine. An adult aged 54 years or older who has certain medical conditions and has not been previously immunized should receive 1 dose of PCV13 vaccine. This PCV13 should be followed with a dose of pneumococcal polysaccharide (PPSV23) vaccine. The PPSV23 vaccine dose should be obtained at least 8 weeks after the dose of PCV13 vaccine. An adult aged 69 years or older who has certain medical conditions and previously received 1 or more doses of PPSV23 vaccine should receive 1 dose of PCV13. The PCV13 vaccine dose should be obtained 1 or more years after the last PPSV23 vaccine dose.    Pneumococcal polysaccharide (PPSV23) vaccine. When PCV13 is also indicated, PCV13 should be obtained first. All adults aged 32 years and older should be immunized. An adult younger than age 11 years who has certain medical conditions should be immunized. Any person who resides in a nursing home or long-term care facility should be immunized. An adult smoker should be immunized. People with  an immunocompromised condition and certain other conditions should receive both PCV13 and PPSV23 vaccines. People with human immunodeficiency virus (HIV) infection should be immunized as soon as possible after diagnosis. Immunization during chemotherapy or radiation therapy should be avoided. Routine use of PPSV23 vaccine is not recommended for American Indians, [place] Natives, or people younger than 65 years unless there are medical conditions that require PPSV23 vaccine. When indicated, people who have unknown immunization and have no record of immunization should receive PPSV23 vaccine. One-time revaccination 5 years after the first dose of PPSV23 is recommended for people aged 19-64 years who have chronic kidney failure, nephrotic syndrome, asplenia, or immunocompromised conditions. People who received 1-2 doses of PPSV23 before age 83 years should receive another dose of PPSV23 vaccine at age 6 years or later if at least 5 years have passed since the previous dose. Doses of PPSV23 are not needed for people immunized with PPSV23 at or after age 42 years.  Preventive Services / Frequency   Ages 78 to 98 years  Blood pressure check.  Lipid and cholesterol check.  Lung cancer screening. / Every year if you are aged 5-80 years and have a 30-pack-year history of smoking and currently smoke or have quit within the past 15 years. Yearly screening is stopped once you have quit smoking for at least 15 years or develop a health problem that would prevent you from having lung cancer treatment.  Clinical breast exam.** / Every year after age 51 years.  BRCA-related cancer risk assessment.** / For women who have family members with a BRCA-related cancer (breast, ovarian, tubal, or peritoneal cancers).  Mammogram.** / Every year beginning at age 36 years and continuing for as long as you are in good health. Consult with your health care provider.  Pap test.** / Every 3 years starting at age 25 years  through age 107 or 33 years with a history of 3 consecutive normal Pap tests.  HPV screening.** / Every 3 years from ages 12 years through ages 68 to 60 years with a history of 3 consecutive normal Pap tests.  Fecal occult blood test (FOBT) of stool. /  Every year beginning at age 71 years and continuing until age 26 years. You may not need to do this test if you get a colonoscopy every 10 years.  Flexible sigmoidoscopy or colonoscopy.** / Every 5 years for a flexible sigmoidoscopy or every 10 years for a colonoscopy beginning at age 62 years and continuing until age 72 years.  Hepatitis C blood test.** / For all people born from [birth year] through 1965 and any individual with known risks for hepatitis C.  Skin self-exam. / Monthly.  Influenza vaccine. / Every year.  Tetanus, diphtheria, and acellular pertussis (Tdap/Td) vaccine.** / Consult your health care provider. Pregnant women should receive 1 dose of Tdap vaccine during each pregnancy. 1 dose of Td every 10 years.  Varicella vaccine.** / Consult your health care provider. Pregnant females who do not have evidence of immunity should receive the first dose after pregnancy.  Zoster vaccine.** / 1 dose for adults aged 81 years or older.  Pneumococcal 13-valent conjugate (PCV13) vaccine.** / Consult your health care provider.  Pneumococcal polysaccharide (PPSV23) vaccine.** / 1 to 2 doses if you smoke cigarettes or if you have certain conditions.  Meningococcal vaccine.** / Consult your health care provider.  Hepatitis A vaccine.** / Consult your health care provider.  Hepatitis B vaccine.** / Consult your health care provider. Screening for abdominal aortic aneurysm (AAA)  by ultrasound is recommended for people over 50 who have history of high blood pressure or who are current or former smokers.

## 2014-07-15 NOTE — Addendum Note (Signed)
Addended by: Vicie Mutters R on: 07/15/2014 01:11 PM   Modules accepted: Orders, SmartSet

## 2014-07-16 LAB — URINALYSIS, ROUTINE W REFLEX MICROSCOPIC
Bilirubin Urine: NEGATIVE
Glucose, UA: NEGATIVE mg/dL
Hgb urine dipstick: NEGATIVE
Ketones, ur: NEGATIVE mg/dL
Leukocytes, UA: NEGATIVE
Nitrite: NEGATIVE
Protein, ur: NEGATIVE mg/dL
Specific Gravity, Urine: 1.008 (ref 1.005–1.030)
Urobilinogen, UA: 0.2 mg/dL (ref 0.0–1.0)
pH: 6.5 (ref 5.0–8.0)

## 2014-07-16 LAB — MICROALBUMIN / CREATININE URINE RATIO
Creatinine, Urine: 29.5 mg/dL
Microalb Creat Ratio: 13.6 mg/g (ref 0.0–30.0)
Microalb, Ur: 0.4 mg/dL (ref <2.0)

## 2014-07-16 LAB — HEPATITIS C ANTIBODY: HCV Ab: NEGATIVE

## 2014-07-16 LAB — VITAMIN D 25 HYDROXY (VIT D DEFICIENCY, FRACTURES): Vit D, 25-Hydroxy: 59 ng/mL (ref 30–100)

## 2014-07-28 ENCOUNTER — Encounter: Payer: Self-pay | Admitting: Internal Medicine

## 2014-07-28 ENCOUNTER — Ambulatory Visit (INDEPENDENT_AMBULATORY_CARE_PROVIDER_SITE_OTHER): Payer: BLUE CROSS/BLUE SHIELD | Admitting: Internal Medicine

## 2014-07-28 ENCOUNTER — Other Ambulatory Visit (INDEPENDENT_AMBULATORY_CARE_PROVIDER_SITE_OTHER): Payer: BLUE CROSS/BLUE SHIELD

## 2014-07-28 VITALS — BP 110/62 | HR 80 | Ht 64.0 in | Wt 111.1 lb

## 2014-07-28 DIAGNOSIS — K589 Irritable bowel syndrome without diarrhea: Secondary | ICD-10-CM | POA: Diagnosis not present

## 2014-07-28 DIAGNOSIS — K648 Other hemorrhoids: Secondary | ICD-10-CM | POA: Diagnosis not present

## 2014-07-28 DIAGNOSIS — R197 Diarrhea, unspecified: Secondary | ICD-10-CM

## 2014-07-28 LAB — IGA: IgA: 136 mg/dL (ref 68–378)

## 2014-07-28 NOTE — Patient Instructions (Addendum)
Your physician has requested that you go to the basement for lab work before leaving today  Please review over the hand outs regarding The FODMAP diet and Gas and Flatulence prevention diet  I appreciate the opportunity to care for you. Gatha Mayer, MD, [facility]

## 2014-07-28 NOTE — Progress Notes (Signed)
   Subjective:    Patient ID: Valerie Hubbard, female    DOB: [birth year], 59 y.o.   MRN: [id number] Cc: IBS HPI Very nice lady known from past visits - hx IBS. Has multiple progressively loose stools many days. Manages w/ loperamide but has bloating and feels gassy. Travels for work and this is problematic. In past has had duodenal bxs - "peptic duodenitis". Hx adenomas on colonoscopies - last 12/2009. Asking about Xifaxan s a Tx. Mild abdominal cramps at times. Never gets constipated. Stress increases sxs.  Wt Readings from Last 3 Encounters:  07/28/14 111 lb 2 oz (50.406 kg)  07/15/14 110 lb (49.896 kg)  07/14/13 110 lb (49.896 kg)   Denies identifiable diet triggers e.g not lactose intolerant and does not use artificial sweeteners. "I do like my wine". Appetite ok.  Does have hemorrhoids with irritation and prolapse sxs - did not see GSU as suggested in past. GI ROS otherwise negative.  Medications, allergies, past medical history, past surgical history, family history and social history are reviewed and updated in the EMR.  Review of Systems All other ROS negative except insomnia or as per HPI    Objective:   Physical Exam @BP  110/62 mmHg  Pulse 80  Ht 5\' 4"  (1.626 m)  Wt 111 lb 2 oz (50.406 kg)  BMI 19.07 kg/m2  LMP 01/01/2005@  General:  Well-developed, well-nourished and in no acute distress Eyes:  anicteric. ENT:   Mouth and posterior pharynx free of lesions.  Lungs: Clear to auscultation bilaterally. Heart:  S1S2, no rubs, murmurs, gallops. Abdomen:  soft, non-tender, no hepatosplenomegaly, hernia, or mass and BS+.  Extremities:   no edema, cyanosis or clubbing Neuro:  A&O x 3.  Psych:  appropriate mood and  Affect.   Data Reviewed: EGD's and colonoscopies 2006-11 PCP notes 2016 2016 labs in EMR      Assessment & Plan:  Diarrhea - Plan: Tissue transglutaminase, IgA, IgA  IBS (irritable bowel syndrome) - Plan: Tissue transglutaminase, IgA, IgA  Internal  hemorrhoids with complication    Check TTG Ab and IgA to evaluate for celiac dz FODMAPS and Low Gas diet info provided  If TTG neg Xifaxan 550 mg tid  x 2 weeks and call me or message me in My Chart in f/u few weeks after for f/u  Consider Lotronex, Viberzi less likely "I like my wine"  Consider hemorrhoidal banding after better control of IBS-D  I appreciate the opportunity to care for this patient

## 2014-07-29 LAB — TISSUE TRANSGLUTAMINASE, IGA: Tissue Transglutaminase Ab, IgA: 1 U/mL (ref <4)

## 2014-07-29 NOTE — Progress Notes (Signed)
Quick Note:  She does not have celiac dz Plan is for Xifaxan 550 mg tid x 14 d for IBS-D She is to call or My Chart w/ f/u 2-4 weeks after taking that ______

## 2014-07-30 ENCOUNTER — Other Ambulatory Visit: Payer: Self-pay

## 2014-07-30 ENCOUNTER — Encounter: Payer: Self-pay | Admitting: Physician Assistant

## 2014-07-30 MED ORDER — RIFAXIMIN 550 MG PO TABS: 550.0000 mg | ORAL_TABLET | Freq: Three times a day (TID) | ORAL | Status: DC

## 2014-08-10 ENCOUNTER — Ambulatory Visit (HOSPITAL_COMMUNITY)
Admission: RE | Admit: 2014-08-10 | Discharge: 2014-08-10 | Disposition: A | Discharge status: Home / Self Care | Payer: BLUE CROSS/BLUE SHIELD | Source: Ambulatory Visit | Attending: Physician Assistant | Admitting: Physician Assistant

## 2014-08-10 DIAGNOSIS — R9431 Abnormal electrocardiogram [ECG] [EKG]: Secondary | ICD-10-CM | POA: Insufficient documentation

## 2014-08-10 DIAGNOSIS — Z0000 Encounter for general adult medical examination without abnormal findings: Secondary | ICD-10-CM

## 2014-08-23 ENCOUNTER — Telehealth: Payer: Self-pay | Admitting: Obstetrics & Gynecology

## 2014-08-23 NOTE — Telephone Encounter (Signed)
Left patient a message to call back to confirm her AEX has been moved from 12:45 PM to 1:30 PM on 08/30/14 with Dr. Sabra Heck due to a surgery conflicy.

## 2014-08-27 ENCOUNTER — Other Ambulatory Visit: Payer: Self-pay

## 2014-08-27 DIAGNOSIS — Z1231 Encounter for screening mammogram for malignant neoplasm of breast: Secondary | ICD-10-CM

## 2014-08-30 ENCOUNTER — Encounter: Payer: Self-pay | Admitting: Obstetrics & Gynecology

## 2014-08-30 ENCOUNTER — Ambulatory Visit: Payer: Self-pay | Admitting: Obstetrics & Gynecology

## 2014-08-30 ENCOUNTER — Ambulatory Visit (INDEPENDENT_AMBULATORY_CARE_PROVIDER_SITE_OTHER): Payer: BLUE CROSS/BLUE SHIELD | Admitting: Obstetrics & Gynecology

## 2014-08-30 VITALS — BP 132/82 | HR 64 | Resp 16 | Ht 64.25 in | Wt 110.0 lb

## 2014-08-30 DIAGNOSIS — Z01419 Encounter for gynecological examination (general) (routine) without abnormal findings: Secondary | ICD-10-CM

## 2014-08-30 DIAGNOSIS — Z124 Encounter for screening for malignant neoplasm of cervix: Secondary | ICD-10-CM | POA: Diagnosis not present

## 2014-08-30 DIAGNOSIS — N841 Polyp of cervix uteri: Secondary | ICD-10-CM | POA: Diagnosis not present

## 2014-08-30 MED ORDER — ESTRADIOL-NORETHINDRONE ACET 1-0.5 MG PO TABS: 1.0000 | ORAL_TABLET | Freq: Every day | ORAL | Status: DC

## 2014-08-30 MED ORDER — ALPRAZOLAM 0.5 MG PO TABS: 0.5000 mg | ORAL_TABLET | Freq: Every evening | ORAL | Status: DC | PRN

## 2014-08-30 NOTE — Progress Notes (Signed)
59 y.o. [id number] MarriedCaucasianF here for annual exam.  No vaginal bleeding.  Pt reports her IBS was worse this past year.  Saw Dr. Carlean Purl in July.  Xifaxan was prescribed.  This really helped.   No vaginal bleeding this past year.  Did try to stop HRT this past year.  Had intolerable hot flashes again.  She hates feeling that way and decided to start back.  She is taking 1/2 tab daily.    Would like a suggestion for a therapist--just someone to talk to about life issues.  Patient's last menstrual period was 01/01/2005.          Sexually active: No.  The current method of family planning is tubal ligation.    Exercising: Yes.    walking, squats, and push-ups Smoker:  no  Health Maintenance: Pap:  06/09/13 WNL/negative HR HPV History of abnormal Pap:  Yes years ago MMG:  09/14/13 3D BiRads 1-negative Colonoscopy:  12/11-repeat in 5 years-aware is due at the end of the year BMD:   02/20/11 TDaP:  2012 Screening Labs: PCP, Hb today: PCP, Urine today: PCP   reports that she has never smoked. She has never used smokeless tobacco. She reports that she drinks about 1.2 oz of alcohol per week. She reports that she does not use illicit drugs.  Past Medical History  Diagnosis Date  . osteopenia   . Menopausal symptom   . Hemorrhoids   . Anemia   . IBS (irritable bowel syndrome)   . Insomnia   . Hyperlipidemia   . GERD (gastroesophageal reflux disease)   . Esophagitis   . Adenomatous colon polyp 12/12/2004  . Duodenitis     Past Surgical History  Procedure Laterality Date  . Excision of ganglion cyst    . Dilation and curettage of uterus      MISSED AB  . Umbilical hernia repair      with BTL  . Colonoscopy w/ biopsies    . Esophagogastroduodenoscopy    . Flexible sigmoidoscopy    . Cosmetic surgery      Family History  Problem Relation Age of Onset  . Breast cancer Mother   . Hypertension Mother   . Osteoporosis Mother   . Colon cancer Maternal Grandfather   . Colon  cancer Maternal Aunt   . Colon cancer Paternal Aunt   . COPD Father   . Lung cancer Father     ROS:  Pertinent items are noted in HPI.  Otherwise, a comprehensive ROS was negative.  Exam:   BP 132/82 mmHg  Pulse 64  Resp 16  Ht 5' 4.25" (1.632 m)  Wt 110 lb (49.896 kg)  BMI 18.73 kg/m2  LMP 01/01/2005  Weight change: -2#   Height: 5' 4.25" (163.2 cm)  Ht Readings from Last 3 Encounters:  08/30/14 5' 4.25" (1.632 m)  07/28/14 5\' 4"  (1.626 m)  07/15/14 5\' 4"  (1.626 m)    General appearance: alert, cooperative and appears stated age Head: Normocephalic, without obvious abnormality, atraumatic Neck: no adenopathy, supple, symmetrical, trachea midline and thyroid normal to inspection and palpation Lungs: clear to auscultation bilaterally Breasts: normal appearance, no masses or tenderness Heart: regular rate and rhythm Abdomen: soft, non-tender; bowel sounds normal; no masses,  no organomegaly Extremities: extremities normal, atraumatic, no cyanosis or edema Skin: Skin color, texture, turgor normal. No rashes or lesions Lymph nodes: Cervical, supraclavicular, and axillary nodes normal. No abnormal inguinal nodes palpated Neurologic: Grossly normal   Pelvic: External genitalia:  no  lesions              Urethra:  normal appearing urethra with no masses, tenderness or lesions              Bartholins and Skenes: normal                 Vagina: normal appearing vagina with normal color and discharge, no lesions              Cervix: no lesions but 1.5cm endocervical polyp noted              Pap taken: No. Bimanual Exam:  Uterus:  normal size, contour, position, consistency, mobility, non-tender              Adnexa: normal adnexa and no mass, fullness, tenderness               Rectovaginal: Confirms               Anus:  normal sphincter tone, no lesions  D/w pt removal of polyp.  Verbal consent obtained.  Polyp grasped with ringed forceps and twisted at base until fully removed.   Will sent to pathology.  Chaperone was present for exam.  A:  Well Woman with normal exam PMP, on HRT.  (Had tried to wean off of this) H/o adenomatous colon polyps  IBS  GERD  P: Mammogram yearly. 3D discussed due to dense breasts. pap smear with HR HPV 2015. Pap today. Polyp to pathology  Rx for activella 1/0.5 mg, taking 1/2 daily.  Rx to pharmacy.  We again discussed risks including DVT/PE (as she travels and takes long distance flights), increased risks of breast cancer specifically in light of having a mother with a post menopausal breast cancer, DUB. Rx for xanax 0.5mg  prn.  #30/0RF.  Pt requests this about every third year. Names for therapist given. Labs with PCP Return annually or prn

## 2014-09-01 LAB — IPS PAP TEST WITH HPV

## 2014-09-07 ENCOUNTER — Ambulatory Visit (INDEPENDENT_AMBULATORY_CARE_PROVIDER_SITE_OTHER): Payer: BLUE CROSS/BLUE SHIELD | Admitting: Obstetrics & Gynecology

## 2014-09-07 ENCOUNTER — Encounter: Payer: Self-pay | Admitting: Obstetrics & Gynecology

## 2014-09-07 ENCOUNTER — Telehealth: Payer: Self-pay

## 2014-09-07 VITALS — BP 120/80 | HR 56 | Resp 16 | Wt 109.0 lb

## 2014-09-07 DIAGNOSIS — N939 Abnormal uterine and vaginal bleeding, unspecified: Secondary | ICD-10-CM

## 2014-09-07 NOTE — Progress Notes (Signed)
Subjective:     Patient ID: Valerie Hubbard, female   DOB: 08/11/1955, 59 y.o.   MRN: [id number]  HPI 59 yo G3P2 WF here for complaint of vaginal bleeding that has occurred since exam on 08/30/14.  Pt had a cervical polyp that was removed.  Pap and polyp pathology were negative.  Pt is aware of this.  She is going out of town tomorrow and wanted to make sure everything was ok before she left.  Bleeding is primarily spotting.  Had some cramping the first day or two after polyp removal but none since.  No vaginal odor or other discharge.  Review of Systems  All other systems reviewed and are negative.      Objective:   Physical Exam  Constitutional: She appears well-developed and well-nourished.  Abdominal: Soft. Bowel sounds are normal.  Genitourinary: Vagina normal. There is no rash, tenderness or lesion on the right labia. There is no rash, tenderness or lesion on the left labia.    Lymphadenopathy:       Right: No inguinal adenopathy present.       Left: No inguinal adenopathy present.  Skin: Skin is warm and dry.  Psychiatric: She has a normal mood and affect.       Assessment:     Vaginal bleeding after polyp removal     Plan:     Pt reassured.  She will let me know if still occurring after she returns from her trip at the end of the week.

## 2014-09-07 NOTE — Telephone Encounter (Signed)
Lmtcb//kn 

## 2014-09-07 NOTE — Telephone Encounter (Signed)
Spoke with patient-offered her a 12:30 appointment with Dr Sabra Heck. Patient will call back, was on the other line and unable to talk.//kn

## 2014-09-07 NOTE — Telephone Encounter (Signed)
Patient calling to let Claiborne Billings know she can come in as early as 3:30 today. Please return a call to [phone]

## 2014-09-07 NOTE — Telephone Encounter (Signed)
-----   Message from Megan Salon, MD sent at 09/02/2014  4:16 PM EDT ----- Please inform pt polyp removed at annual exam was benign.  Nothing else is needed.  Her pap was normal too.  I released the pap result to her already.

## 2014-09-09 NOTE — Telephone Encounter (Signed)
Patient was seen last week.//kn

## 2014-09-13 ENCOUNTER — Telehealth: Payer: Self-pay | Admitting: Internal Medicine

## 2014-09-13 NOTE — Telephone Encounter (Signed)
Left message for patient to call back  

## 2014-09-13 NOTE — Telephone Encounter (Signed)
Patient is scheduled for follow up on 09/23/14 3:30

## 2014-09-21 ENCOUNTER — Other Ambulatory Visit: Payer: BLUE CROSS/BLUE SHIELD

## 2014-09-21 DIAGNOSIS — Z79899 Other long term (current) drug therapy: Secondary | ICD-10-CM

## 2014-09-21 LAB — CBC WITH DIFFERENTIAL/PLATELET
Basophils Absolute: 0.1 10*3/uL (ref 0.0–0.1)
Basophils Relative: 1 % (ref 0–1)
Eosinophils Absolute: 0.2 10*3/uL (ref 0.0–0.7)
Eosinophils Relative: 2 % (ref 0–5)
HCT: 41.7 % (ref 36.0–46.0)
Hemoglobin: 13.9 g/dL (ref 12.0–15.0)
Lymphocytes Relative: 28 % (ref 12–46)
Lymphs Abs: 2.4 10*3/uL (ref 0.7–4.0)
MCH: 32.3 pg (ref 26.0–34.0)
MCHC: 33.3 g/dL (ref 30.0–36.0)
MCV: 96.8 fL (ref 78.0–100.0)
MPV: 8.8 fL (ref 8.6–12.4)
Monocytes Absolute: 0.6 10*3/uL (ref 0.1–1.0)
Monocytes Relative: 7 % (ref 3–12)
Neutro Abs: 5.4 10*3/uL (ref 1.7–7.7)
Neutrophils Relative %: 62 % (ref 43–77)
Platelets: 309 10*3/uL (ref 150–400)
RBC: 4.31 MIL/uL (ref 3.87–5.11)
RDW: 13.7 % (ref 11.5–15.5)
WBC: 8.7 10*3/uL (ref 4.0–10.5)

## 2014-09-21 NOTE — Addendum Note (Signed)
Addended by: HELMER, REBECCA A on: 09/21/2014 02:44 PM   Modules accepted: Orders

## 2014-09-23 ENCOUNTER — Ambulatory Visit (INDEPENDENT_AMBULATORY_CARE_PROVIDER_SITE_OTHER): Payer: BLUE CROSS/BLUE SHIELD | Admitting: Internal Medicine

## 2014-09-23 ENCOUNTER — Encounter: Payer: Self-pay | Admitting: Internal Medicine

## 2014-09-23 VITALS — BP 108/68 | HR 76 | Ht 64.0 in | Wt 108.1 lb

## 2014-09-23 DIAGNOSIS — K648 Other hemorrhoids: Secondary | ICD-10-CM | POA: Diagnosis not present

## 2014-09-23 DIAGNOSIS — K589 Irritable bowel syndrome without diarrhea: Secondary | ICD-10-CM | POA: Diagnosis not present

## 2014-09-23 NOTE — Progress Notes (Signed)
   Subjective:    Patient ID: Valerie Hubbard, female    DOB: 1955/12/22, 59 y.o.   MRN: [id number] Chief complaint: Follow-up IBS and diarrhea HPI The patient is here for follow-up, she did well for several weeks at least after Xifaxan but the diarrhea has returned though probably not as severe it is back. Hemorrhoids will still occasionally bleed intermittently. Seem to be bleeding less though she thinks it might have something to do with stopping her baby aspirin in the last couple of weeks. Medications, allergies, past medical history, past surgical history, family history and social history are reviewed and updated in the EMR.   Review of Systems As above    Objective:   Physical Exam BP 108/68 mmHg  Pulse 76  Ht 5\' 4"  (1.626 m)  Wt 108 lb 2 oz (49.045 kg)  BMI 18.55 kg/m2  LMP 01/01/2005 No acute distress       Assessment & Plan:   1. IBS (irritable bowel syndrome)   2. Internal bleeding hemorrhoids    She is somewhat improved overall though seems to be slipping back towards diarrhea problems. She wants to try a probiotic which I think is fine she will take VS L #3 2 doses daily  Should that fail after couple of months I think the next step would be to take Lotronex. She drinks 2 glasses of wine a day on average so Viberzi is not optimal. I have fully reviewed the potential side effects of Lotronex with her today and in the past. She will contact me and we can prescribe that over the phone and then set up follow-up if that's the route we take.  I appreciate the opportunity to care for this patient.  15 minutes time spent with patient > half in counseling coordination of care

## 2014-09-23 NOTE — Patient Instructions (Addendum)
Go to [facility] and purchase VSL #3 and take 2 every day as discussed.    I appreciate the opportunity to care for you. Silvano Rusk, MD, [facility]

## 2014-10-02 HISTORY — PX: BREAST ENHANCEMENT SURGERY: SHX7

## 2014-10-05 ENCOUNTER — Ambulatory Visit: Payer: BLUE CROSS/BLUE SHIELD

## 2014-10-07 ENCOUNTER — Ambulatory Visit: Payer: BLUE CROSS/BLUE SHIELD

## 2014-10-08 ENCOUNTER — Ambulatory Visit
Admission: RE | Admit: 2014-10-08 | Discharge: 2014-10-08 | Disposition: A | Discharge status: Home / Self Care | Payer: BLUE CROSS/BLUE SHIELD | Source: Ambulatory Visit

## 2014-10-08 DIAGNOSIS — Z1231 Encounter for screening mammogram for malignant neoplasm of breast: Secondary | ICD-10-CM

## 2014-10-19 ENCOUNTER — Ambulatory Visit (INDEPENDENT_AMBULATORY_CARE_PROVIDER_SITE_OTHER): Payer: BLUE CROSS/BLUE SHIELD | Admitting: Physician Assistant

## 2014-10-19 ENCOUNTER — Encounter: Payer: Self-pay | Admitting: Physician Assistant

## 2014-10-19 VITALS — BP 100/70 | HR 64 | Temp 97.7°F | Resp 16 | Ht 64.0 in | Wt 110.0 lb

## 2014-10-19 DIAGNOSIS — J0100 Acute maxillary sinusitis, unspecified: Secondary | ICD-10-CM

## 2014-10-19 MED ORDER — AZITHROMYCIN 250 MG PO TABS: ORAL_TABLET | ORAL | Status: AC

## 2014-10-19 MED ORDER — PROMETHAZINE-CODEINE 6.25-10 MG/5ML PO SYRP: 5.0000 mL | ORAL_SOLUTION | Freq: Four times a day (QID) | ORAL | Status: DC | PRN

## 2014-10-19 MED ORDER — PREDNISONE 20 MG PO TABS: ORAL_TABLET | ORAL | Status: DC

## 2014-10-19 NOTE — Progress Notes (Signed)
Subjective:    Patient ID: Valerie Hubbard, female    DOB: 03/17/1955, 59 y.o.   MRN: [id number]  HPI 59 y.o. WF presents with sore throat, cough x 1 week. Sneezing, sore throat, drainage, cough productive white worse with lying down at night, fatigue, low grade temperature. [facility], [facility], cough pills.   Blood pressure 100/70, pulse 64, temperature 97.7 F (36.5 C), temperature source Temporal, resp. rate 16, height 5\' 4"  (1.626 m), weight 110 lb (49.896 kg), last menstrual period 01/01/2005, SpO2 92 %.  Current Outpatient Prescriptions on File Prior to Visit  Medication Sig Dispense Refill  . ALPRAZolam (XANAX) 0.5 MG tablet Take 1 tablet (0.5 mg total) by mouth at bedtime as needed. 30 tablet 0  . BABY ASPIRIN PO Take 81 mg by mouth daily.    . Calcium Carbonate-Vitamin D (CALCIUM + D PO) Take by mouth.    . estradiol-norethindrone (ACTIVELLA) 1-0.5 MG per tablet Take 1 tablet by mouth daily. 30 tablet 13  . fish oil-omega-3 fatty acids 1000 MG capsule Take 2 g by mouth daily.    Marland Kitchen Hyoscyamine Sulfate (LEVSIN PO) Take by mouth as needed.     . loperamide (IMODIUM) 2 MG capsule Take by mouth as needed for diarrhea or loose stools.    . Multiple Vitamin (MULTIVITAMIN) capsule Take 1 capsule by mouth daily.    Earney Navy Bicarbonate (ZEGERID OTC PO) Take by mouth.     No current facility-administered medications on file prior to visit.    Past Medical History  Diagnosis Date  . osteopenia   . Menopausal symptom   . Hemorrhoids   . Anemia   . IBS (irritable bowel syndrome)   . Insomnia   . Hyperlipidemia   . GERD (gastroesophageal reflux disease)   . Esophagitis   . Adenomatous colon polyp 12/12/2004  . Duodenitis    Review of Systems  Constitutional: Negative for chills and diaphoresis.  HENT: Positive for congestion, postnasal drip, sinus pressure and sneezing. Negative for ear pain and sore throat.   Respiratory: Positive for cough.  Negative for chest tightness, shortness of breath and wheezing.   Cardiovascular: Negative.   Gastrointestinal: Negative.   Genitourinary: Negative.   Musculoskeletal: Negative for neck pain.  Neurological: Positive for headaches.       Objective:   Physical Exam  Constitutional: She is oriented to person, place, and time. She appears well-developed and well-nourished.  HENT:  Right Ear: Hearing and external ear normal. No mastoid tenderness. Tympanic membrane is injected. Tympanic membrane is not perforated, not erythematous, not retracted and not bulging. A middle ear effusion is present.  Left Ear: Hearing and external ear normal. No mastoid tenderness. Tympanic membrane is injected. Tympanic membrane is not perforated, not erythematous, not retracted and not bulging. A middle ear effusion is present.  Nose: Right sinus exhibits maxillary sinus tenderness. Left sinus exhibits maxillary sinus tenderness.  Mouth/Throat: Uvula is midline, oropharynx is clear and moist and mucous membranes are normal.  Eyes: Conjunctivae and EOM are normal. Pupils are equal, round, and reactive to light.  Neck: Neck supple.  Cardiovascular: Normal rate and regular rhythm.   Pulmonary/Chest: Effort normal and breath sounds normal. No respiratory distress. She has no wheezes.  Abdominal: Soft. Bowel sounds are normal.  Musculoskeletal: Normal range of motion.  Lymphadenopathy:    She has no cervical adenopathy.  Neurological: She is alert and oriented to person, place, and time.  Skin: Skin is  warm and dry.       Assessment & Plan:  1. Acute maxillary sinusitis, recurrence not specified [J01.00] Will hold the zpak and take if she is not getting better, increase fluids, rest, cont allergy pill - azithromycin (ZITHROMAX) 250 MG tablet; Take 2 tablets (500 mg) on  Day 1,  followed by 1 tablet (250 mg) once daily on Days 2 through 5.  Dispense: 6 each; Refill: 1 - promethazine-codeine (PHENERGAN WITH  CODEINE) 6.25-10 MG/5ML syrup; Take 5 mLs by mouth every 6 (six) hours as needed for cough. Max: 43mL per day  Dispense: 240 mL; Refill: 0 - predniSONE (DELTASONE) 20 MG tablet; 2 tablets daily for 3 days, 1 tablet daily for 4 days.  Dispense: 10 tablet; Refill: 0

## 2014-10-19 NOTE — Patient Instructions (Signed)
Sinusitis can be uncomfortable. People with sinusitis have congestion with yellow/green/gray discharge, sinus pain/pressure, pain around the eyes. Sinus infections almost ALWAYS stem from a viral infection and antibiotics don't work against a virus. Even when bacteria is responsible, the infections usually clear up on their own in a week or so.   PLEASE TRY TO DO OVER THE COUNTER TREATMENT AND PREDNISONE FOR 5-7 DAYS AND IF YOU ARE NOT GETTING BETTER OR GETTING WORSE THEN YOU CAN START ON AN ANTIBIOTIC GIVEN.  Can take the prednisone AT NIGHT WITH DINNER, it take 8-12 hours to start working so it will NOT affect your sleeping if you take it at night with your food!! Can do 1 pill for 2-3 nights.   Risk of antibiotic use: About 1 in 4 people who take antibiotics have side effects including stomach problems, dizziness, or rashes. Those problems clear up soon after stopping the drugs, but in rare cases antibiotics can cause severe allergic reaction. Over use of antibiotics also encourages the growth of bacteria that can't be controlled easily with drugs. That makes you more vunerable to antibiotic-resistant infections and undermines the benefits of antibiotics for others.   Waste of Money: Antibiotics often aren't very expensive, but any money spent on unnecessary drugs is money down the drain.   When are antibiotics needed? Only when symptoms last longer than a week.  Start to improve but then worsen again  -It can take up to 2 weeks to feel better.   -If you do not get better in 7-10 days (Have fever, facial pain, dental pain and swelling), then please call the office and it is now appropriate to start an antibiotic.   -Please take Tylenol or Ibuprofen for pain. -Acetaminiphen 325mg  orally every 4-6 hours for pain.  Max: 10 per day -Ibuprofen 200mg  orally every 6-8 hours for pain.  Take with food to avoid ulcers.   Max 10 per day  Please pick one of the over the counter allergy medications  below and take it once daily for allergies.  Claritin or loratadine cheapest but likely the weakest  Zyrtec or certizine at night because it can make you sleepy The strongest is allegra or fexafinadine  Cheapest at [employer], [employer], [employer]  -While drinking fluids, pinch and hold nose close and swallow.  This will help open up your eustachian tubes to drain the fluid behind your ear drums. -Try steam showers to open your nasal passages.   Drink lots of water to stay hydrated and to thin mucous.  Flonase/Nasonex is to help the inflammation.  Take 2 sprays in each nostril at bedtime.  Make sure you spray towards the outside of each nostril towards the outer corner of your eye, hold nose close and tilt head back.  This will help the medication get into your sinuses.  If you do not like this medication, then use saline nasal sprays same directions as above for Flonase. Stop the medication right away if you get blurring of your vision or nose bleeds.  Sinusitis Sinusitis is redness, soreness, and inflammation of the paranasal sinuses. Paranasal sinuses are air pockets within the bones of your face (beneath the eyes, the middle of the forehead, or above the eyes). In healthy paranasal sinuses, mucus is able to drain out, and air is able to circulate through them by way of your nose. However, when your paranasal sinuses are inflamed, mucus and air can become trapped. This can allow bacteria and other germs to grow and cause infection.  Sinusitis can develop quickly and last only a short time (acute) or continue over a long period (chronic). Sinusitis that lasts for more than 12 weeks is considered chronic.  CAUSES  Causes of sinusitis include: Allergies. Structural abnormalities, such as displacement of the cartilage that separates your nostrils (deviated septum), which can decrease the air flow through your nose and sinuses and affect sinus drainage. Functional abnormalities, such as when the small hairs  (cilia) that line your sinuses and help remove mucus do not work properly or are not present. SIGNS AND SYMPTOMS  Symptoms of acute and chronic sinusitis are the same. The primary symptoms are pain and pressure around the affected sinuses. Other symptoms include: Upper toothache. Earache. Headache. Bad breath. Decreased sense of smell and taste. A cough, which worsens when you are lying flat. Fatigue. Fever. Thick drainage from your nose, which often is green and may contain pus (purulent). Swelling and warmth over the affected sinuses. DIAGNOSIS  Your health care provider will perform a physical exam. During the exam, your health care provider may: Look in your nose for signs of abnormal growths in your nostrils (nasal polyps).  Tap over the affected sinus to check for signs of infection. View the inside of your sinuses (endoscopy) using an imaging device that has a light attached (endoscope). If your health care provider suspects that you have chronic sinusitis, one or more of the following tests may be recommended: Allergy tests. Nasal culture. A sample of mucus is taken from your nose, sent to a lab, and screened for bacteria. Nasal cytology. A sample of mucus is taken from your nose and examined by your health care provider to determine if your sinusitis is related to an allergy. TREATMENT  Most cases of acute sinusitis are related to a viral infection and will resolve on their own within 10 days. Sometimes medicines are prescribed to help relieve symptoms (pain medicine, decongestants, nasal steroid sprays, or saline sprays).  However, for sinusitis related to a bacterial infection, your health care provider will prescribe antibiotic medicines. These are medicines that will help kill the bacteria causing the infection.  Rarely, sinusitis is caused by a fungal infection. In theses cases, your health care provider will prescribe antifungal medicine. For some cases of chronic sinusitis,  surgery is needed. Generally, these are cases in which sinusitis recurs more than 3 times per year, despite other treatments. HOME CARE INSTRUCTIONS  Drink plenty of water. Water helps thin the mucus so your sinuses can drain more easily. Use a humidifier. Inhale steam 3 to 4 times a day (for example, sit in the bathroom with the shower running). Apply a warm, moist washcloth to your face 3 to 4 times a day, or as directed by your health care provider. Use saline nasal sprays to help moisten and clean your sinuses. Take medicines only as directed by your health care provider. If you were prescribed either an antibiotic or antifungal medicine, finish it all even if you start to feel better. SEEK IMMEDIATE MEDICAL CARE IF: You have increasing pain or severe headaches. You have nausea, vomiting, or drowsiness. You have swelling around your face. You have vision problems. You have a stiff neck. You have difficulty breathing. MAKE SURE YOU:  Understand these instructions. Will watch your condition. Will get help right away if you are not doing well or get worse. Document Released: 12/18/2004 Document Revised: 05/04/2013 Document Reviewed: 01/02/2011 [facility] Patient Information 2015 [place], [place]. This information is not intended to replace advice given  to you by your health care provider. Make sure you discuss any questions you have with your health care provider.

## 2014-10-22 ENCOUNTER — Telehealth: Payer: Self-pay

## 2014-10-22 MED ORDER — BENZONATATE 100 MG PO CAPS: 200.0000 mg | ORAL_CAPSULE | Freq: Three times a day (TID) | ORAL | Status: DC | PRN

## 2014-10-22 MED ORDER — POLYMYXIN B-TRIMETHOPRIM 10000-0.1 UNIT/ML-% OP SOLN: 1.0000 [drp] | OPHTHALMIC | Status: DC

## 2014-10-22 NOTE — Telephone Encounter (Signed)
Patient given zpak on the 18th, finish ABX, will send in eye drops and will send in tessalon pearles.

## 2014-10-22 NOTE — Telephone Encounter (Signed)
No relief with cough medicine prescribed. C/o cough, red, watery eyes and woke up this morning with them matted shut. Requesting eye drops and abx for cough. Please advise

## 2014-10-22 NOTE — Telephone Encounter (Signed)
Spoke w/ pt and she is aware of Rx being sent in to Pharm.

## 2014-11-21 ENCOUNTER — Encounter: Payer: Self-pay | Admitting: *Deleted

## 2014-12-06 ENCOUNTER — Other Ambulatory Visit: Payer: BLUE CROSS/BLUE SHIELD

## 2014-12-06 DIAGNOSIS — R899 Unspecified abnormal finding in specimens from other organs, systems and tissues: Secondary | ICD-10-CM

## 2014-12-06 LAB — CBC WITH DIFFERENTIAL/PLATELET
Basophils Absolute: 0.1 10*3/uL (ref 0.0–0.1)
Basophils Relative: 2 % — ABNORMAL HIGH (ref 0–1)
Eosinophils Absolute: 0.2 10*3/uL (ref 0.0–0.7)
Eosinophils Relative: 3 % (ref 0–5)
HCT: 41.3 % (ref 36.0–46.0)
Hemoglobin: 14 g/dL (ref 12.0–15.0)
Lymphocytes Relative: 35 % (ref 12–46)
Lymphs Abs: 2.2 10*3/uL (ref 0.7–4.0)
MCH: 32.2 pg (ref 26.0–34.0)
MCHC: 33.9 g/dL (ref 30.0–36.0)
MCV: 94.9 fL (ref 78.0–100.0)
MPV: 8.5 fL — ABNORMAL LOW (ref 8.6–12.4)
Monocytes Absolute: 0.5 10*3/uL (ref 0.1–1.0)
Monocytes Relative: 8 % (ref 3–12)
Neutro Abs: 3.3 10*3/uL (ref 1.7–7.7)
Neutrophils Relative %: 52 % (ref 43–77)
Platelets: 297 10*3/uL (ref 150–400)
RBC: 4.35 MIL/uL (ref 3.87–5.11)
RDW: 14.3 % (ref 11.5–15.5)
WBC: 6.3 10*3/uL (ref 4.0–10.5)

## 2014-12-22 ENCOUNTER — Encounter: Payer: Self-pay | Admitting: Internal Medicine

## 2015-01-02 HISTORY — PX: COLONOSCOPY: SHX174

## 2015-01-02 HISTORY — PX: HEMORRHOID BANDING: SHX5850

## 2015-01-06 ENCOUNTER — Encounter: Payer: Self-pay | Admitting: Internal Medicine

## 2015-02-02 ENCOUNTER — Ambulatory Visit (AMBULATORY_SURGERY_CENTER): Payer: Self-pay | Admitting: *Deleted

## 2015-02-02 VITALS — Ht 64.0 in | Wt 111.0 lb

## 2015-02-02 DIAGNOSIS — Z86010 Personal history of colonic polyps: Secondary | ICD-10-CM

## 2015-02-02 MED ORDER — NA SULFATE-K SULFATE-MG SULF 17.5-3.13-1.6 GM/177ML PO SOLN: ORAL | Status: DC

## 2015-02-02 NOTE — Progress Notes (Signed)
Patient denies any allergies to eggs or soy. Patient denies any problems with anesthesia/sedation. Patient denies any oxygen use at home and does not take any diet/weight loss medications. EMMI education assisgned to patient on colonoscopy, this was explained and instructions given to patient. Patient request lower volume prep. suprep given.

## 2015-02-11 ENCOUNTER — Ambulatory Visit (AMBULATORY_SURGERY_CENTER): Payer: BLUE CROSS/BLUE SHIELD | Admitting: Internal Medicine

## 2015-02-11 ENCOUNTER — Encounter: Payer: Self-pay | Admitting: Internal Medicine

## 2015-02-11 VITALS — BP 121/83 | HR 62 | Temp 98.4°F | Resp 17 | Wt 111.0 lb

## 2015-02-11 DIAGNOSIS — K621 Rectal polyp: Secondary | ICD-10-CM | POA: Diagnosis not present

## 2015-02-11 DIAGNOSIS — Z86010 Personal history of colonic polyps: Secondary | ICD-10-CM

## 2015-02-11 DIAGNOSIS — D128 Benign neoplasm of rectum: Secondary | ICD-10-CM

## 2015-02-11 MED ORDER — SODIUM CHLORIDE 0.9 % IV SOLN: 500.0000 mL | INTRAVENOUS | Status: DC

## 2015-02-11 NOTE — Progress Notes (Signed)
To pacu, vss, patent aw report to rn

## 2015-02-11 NOTE — Patient Instructions (Addendum)
No polyps but there was a lesion in the rectum - a submucosal nodule or small mass. I took some biopsies but think you will need a special test called an endoscopic ultrasound. I will let you know.  Also saw internal hemorrhoids.  I appreciate the opportunity to care for you. Gatha Mayer, MD, [facility]  Discharge instructions given. Biopsies taken. Handout on hemorrhoids. Resume previous medications. YOU HAD AN ENDOSCOPIC PROCEDURE TODAY AT [facility]:   Refer to the procedure report that was given to you for any specific questions about what was found during the examination.  If the procedure report does not answer your questions, please call your gastroenterologist to clarify.  If you requested that your care partner not be given the details of your procedure findings, then the procedure report has been included in a sealed envelope for you to review at your convenience later.  YOU SHOULD EXPECT: Some feelings of bloating in the abdomen. Passage of more gas than usual.  Walking can help get rid of the air that was put into your GI tract during the procedure and reduce the bloating. If you had a lower endoscopy (such as a colonoscopy or flexible sigmoidoscopy) you may notice spotting of blood in your stool or on the toilet paper. If you underwent a bowel prep for your procedure, you may not have a normal bowel movement for a few days.  Please Note:  You might notice some irritation and congestion in your nose or some drainage.  This is from the oxygen used during your procedure.  There is no need for concern and it should clear up in a day or so.  SYMPTOMS TO REPORT IMMEDIATELY:   Following lower endoscopy (colonoscopy or flexible sigmoidoscopy):  Excessive amounts of blood in the stool  Significant tenderness or worsening of abdominal pains  Swelling of the abdomen that is new, acute  Fever of 100F or higher  For urgent or emergent issues, a gastroenterologist  can be reached at any hour by calling [phone].   DIET: Your first meal following the procedure should be a small meal and then it is ok to progress to your normal diet. Heavy or fried foods are harder to digest and may make you feel nauseous or bloated.  Likewise, meals heavy in dairy and vegetables can increase bloating.  Drink plenty of fluids but you should avoid alcoholic beverages for 24 hours.  ACTIVITY:  You should plan to take it easy for the rest of today and you should NOT DRIVE or use heavy machinery until tomorrow (because of the sedation medicines used during the test).    FOLLOW UP: Our staff will call the number listed on your records the next business day following your procedure to check on you and address any questions or concerns that you may have regarding the information given to you following your procedure. If we do not reach you, we will leave a message.  However, if you are feeling well and you are not experiencing any problems, there is no need to return our call.  We will assume that you have returned to your regular daily activities without incident.  If any biopsies were taken you will be contacted by phone or by letter within the next 1-3 weeks.  Please call us at [phone] if you have not heard about the biopsies in 3 weeks.    SIGNATURES/CONFIDENTIALITY: You and/or your care partner have signed paperwork which will be entered  into your electronic medical record.  These signatures attest to the fact that that the information above on your After Visit Summary has been reviewed and is understood.  Full responsibility of the confidentiality of this discharge information lies with you and/or your care-partner.

## 2015-02-11 NOTE — Op Note (Signed)
[address] [place], [address]   COLONOSCOPY PROCEDURE REPORT  PATIENT: Valerie Hubbard, Valerie Hubbard  MR#: [id number] BIRTHDATE: 02/13/1955 , 67  yrs. old GENDER: female ENDOSCOPIST: Gatha Mayer, MD, [facility] PROCEDURE DATE:  02/11/2015 PROCEDURE:   Colonoscopy, surveillance and Colonoscopy with biopsy First Screening Colonoscopy - Avg.  risk and is 50 yrs.  old or older - No.  Prior Negative Screening - Now for repeat screening. N/A  History of Adenoma - Now for follow-up colonoscopy & has been > or = to 3 yrs.  Yes hx of adenoma.  Has been 3 or more years since last colonoscopy.  Polyps removed today? No Recommend repeat exam, <10 yrs? Yes high risk ASA CLASS:   Class II INDICATIONS:Surveillance due to prior colonic neoplasia and PH Colon Adenoma. MEDICATIONS: Propofol 300 mg IV and Monitored anesthesia care  DESCRIPTION OF PROCEDURE:   After the risks benefits and alternatives of the procedure were thoroughly explained, informed consent was obtained.  The digital rectal exam revealed no abnormalities of the rectum.   The LB CF-H180AL Loaner [id number] endoscope was introduced through the anus and advanced to the cecum, which was identified by both the appendix and ileocecal valve. No adverse events experienced.   The quality of the prep was excellent.  (MiraLax was used)  The instrument was then slowly withdrawn as the colon was fully examined. Estimated blood loss is zero unless otherwise noted in this procedure report.      COLON FINDINGS: Submucosal nodule/mass in rectum - 1 cm approximately - firm - biopsied.   Internal hemorrhoids were found. The examination was otherwise normal.  Retroflexed views revealed internal hemorrhoids. The time to cecum = 4.0 Withdrawal time = 8.6 The scope was withdrawn and the procedure completed. COMPLICATIONS: There were no immediate complications.  ENDOSCOPIC IMPRESSION: 1.   Submucosal nodule/mass in rectum - 1 cm  approximately - firm - biopsied 2.   Internal hemorrhoids 3.   The examination was otherwise normal - she has hx adenomas and FHx polyps and colon cancer (distant relatives)  RECOMMENDATIONS: Repeat Colonoscopy in 5 years. Await pathology though anticipate endoscopic ultrasound of rectal lesion - ? GIST/leiomyoma vs a carcinoid.  eSigned:  Gatha Mayer, MD, [facility] 02/11/2015 9:32 AM   cc: The Patient

## 2015-02-11 NOTE — Progress Notes (Signed)
Called to room to assist during endoscopic procedure.  Patient ID and intended procedure confirmed with present staff. Received instructions for my participation in the procedure from the performing physician.  

## 2015-02-14 ENCOUNTER — Telehealth: Payer: Self-pay | Admitting: *Deleted

## 2015-02-14 NOTE — Telephone Encounter (Signed)
  Follow up Call-  Call back number 02/11/2015  Post procedure Call Back phone  # [phone]  Permission to leave phone message Yes     Patient questions:  Do you have a fever, pain , or abdominal swelling? No. Pain Score  0 *  Have you tolerated food without any problems? Yes.    Have you been able to return to your normal activities? Yes.    Do you have any questions about your discharge instructions: Diet   No. Medications  No. Follow up visit  No.  Do you have questions or concerns about your Care? No.  Actions: * If pain score is 4 or above: No action needed, pain <4.

## 2015-02-16 ENCOUNTER — Encounter: Payer: Self-pay | Admitting: Obstetrics and Gynecology

## 2015-02-16 ENCOUNTER — Ambulatory Visit (INDEPENDENT_AMBULATORY_CARE_PROVIDER_SITE_OTHER): Payer: BLUE CROSS/BLUE SHIELD | Admitting: Obstetrics and Gynecology

## 2015-02-16 VITALS — BP 118/70 | HR 72 | Resp 14 | Wt 110.0 lb

## 2015-02-16 DIAGNOSIS — R300 Dysuria: Secondary | ICD-10-CM | POA: Diagnosis not present

## 2015-02-16 DIAGNOSIS — N930 Postcoital and contact bleeding: Secondary | ICD-10-CM

## 2015-02-16 LAB — POCT URINALYSIS DIPSTICK
Bilirubin, UA: NEGATIVE
Glucose, UA: NEGATIVE
Ketones, UA: NEGATIVE
Nitrite, UA: NEGATIVE
Protein, UA: NEGATIVE
Urobilinogen, UA: NEGATIVE
pH, UA: 7

## 2015-02-16 MED ORDER — PHENAZOPYRIDINE HCL 200 MG PO TABS: 200.0000 mg | ORAL_TABLET | Freq: Three times a day (TID) | ORAL | Status: DC | PRN

## 2015-02-16 MED ORDER — NITROFURANTOIN MONOHYD MACRO 100 MG PO CAPS: ORAL_CAPSULE | ORAL | Status: DC

## 2015-02-16 NOTE — Progress Notes (Signed)
Patient ID: Valerie Hubbard, female   DOB: 15-Sep-1955, 60 y.o.   MRN: [id number] GYNECOLOGY  VISIT   HPI: 60 y.o.   Divorced  Caucasian  female   [phone] with Patient's last menstrual period was 01/01/2005.   here c/o dysuria since last night. The pain is a mild, burning pain. No frequency, urgency, fevers, no flank pain. She has a new sexual partner. There was a tiny bit of blood the first few times she had intercourse. Slightly uncomfortable, didn't feel like she tore. She is using a lubricant. She had a cervical polyp removed by Dr Sabra Heck in the fall.   GYNECOLOGIC HISTORY: Patient's last menstrual period was 01/01/2005. Contraception:postmenopause Menopausal hormone therapy: estradiol         OB History    Gravida Para Term Preterm AB TAB SAB Ectopic Multiple Living   3 2 2  1     2          Patient Active Problem List   Diagnosis Date Noted  . Insomnia   . Low bone mass   . Menopausal symptom   . Hyperlipidemia 02/18/2007  . GERD 02/18/2007  . IBS 02/18/2007  . FIBROCYSTIC BREAST DISEASE 02/18/2007  . COLONIC POLYPS, ADENOMATOUS 12/12/2004  . Internal hemorrhoids 12/12/2004    Past Medical History  Diagnosis Date  . osteopenia   . Menopausal symptom   . Hemorrhoids   . Anemia   . IBS (irritable bowel syndrome)   . Insomnia   . Hyperlipidemia   . GERD (gastroesophageal reflux disease)   . Esophagitis   . Adenomatous colon polyp 12/12/2004  . Duodenitis     Past Surgical History  Procedure Laterality Date  . Excision of ganglion cyst    . Dilation and curettage of uterus      MISSED AB  . Umbilical hernia repair      with BTL  . Colonoscopy w/ biopsies    . Esophagogastroduodenoscopy    . Flexible sigmoidoscopy    . Cosmetic surgery      Current Outpatient Prescriptions  Medication Sig Dispense Refill  . ALPRAZolam (XANAX) 0.5 MG tablet Take 1 tablet (0.5 mg total) by mouth at bedtime as needed. 30 tablet 0  . BABY ASPIRIN PO Take 81 mg by mouth daily.     . Calcium Carbonate-Vitamin D (CALCIUM + D PO) Take by mouth.    . estradiol-norethindrone (ACTIVELLA) 1-0.5 MG per tablet Take 1 tablet by mouth daily. 30 tablet 13  . fish oil-omega-3 fatty acids 1000 MG capsule Take 2 g by mouth daily.    Marland Kitchen Hyoscyamine Sulfate (LEVSIN PO) Take by mouth as needed. Reported on 02/02/2015    . loperamide (IMODIUM) 2 MG capsule Take by mouth as needed for diarrhea or loose stools. Reported on 02/02/2015    . Multiple Vitamin (MULTIVITAMIN) capsule Take 1 capsule by mouth daily. Reported on 02/02/2015    . Omeprazole-Sodium Bicarbonate (ZEGERID OTC PO) Take by mouth. Reported on 02/02/2015    . nitrofurantoin, macrocrystal-monohydrate, (MACROBID) 100 MG capsule Take one capsule BID x 7 days 14 capsule 0  . phenazopyridine (PYRIDIUM) 200 MG tablet Take 1 tablet (200 mg total) by mouth 3 (three) times daily as needed for pain. 6 tablet 0   No current facility-administered medications for this visit.     ALLERGIES: Sulfonamide derivatives  Family History  Problem Relation Age of Onset  . Breast cancer Mother   . Hypertension Mother   . Osteoporosis Mother   .  Colon cancer Maternal Grandfather   . Colon cancer Maternal Aunt   . Colon cancer Paternal Aunt   . COPD Father   . Lung cancer Father   . Colon polyps Father   . Colon polyps Sister   . Colon polyps Brother     Social History   Social History  . Marital Status: Divorced    Spouse Name: N/A  . Number of Children: N/A  . Years of Education: N/A   Occupational History  . Not on file.   Social History Main Topics  . Smoking status: Never Smoker   . Smokeless tobacco: Never Used  . Alcohol Use: 9.6 oz/week    14 Glasses of wine, 2 Standard drinks or equivalent per week  . Drug Use: No  . Sexual Activity:    Partners: Male    Birth Control/ Protection: Post-menopausal, Surgical     Comment: BTL   Other Topics Concern  . Not on file   Social History Narrative  She was married for over 70  years, she has been divorced for a year.  Review of Systems  Constitutional: Negative.   HENT: Negative.   Eyes: Negative.   Respiratory: Negative.   Cardiovascular: Negative.   Gastrointestinal: Negative.   Genitourinary: Positive for dysuria.  Musculoskeletal: Negative.   Skin: Negative.   Neurological: Negative.   Endo/Heme/Allergies: Negative.   Psychiatric/Behavioral: Negative.     PHYSICAL EXAMINATION:    BP 118/70 mmHg  Pulse 72  Resp 14  Wt 110 lb (49.896 kg)  LMP 01/01/2005    General appearance: alert, cooperative and appears stated age Abdomen: soft, non-tender; bowel sounds normal; no masses,  no organomegaly CVA: not tender  Pelvic: External genitalia:  no lesions              Urethra:  normal appearing urethra with no masses, tenderness or lesions              Bartholins and Skenes: normal                 Vagina: normal appearing vagina with normal color and discharge, no lesions, mild atrophy, no lacerations              Cervix: no lesions              Bimanual Exam:  Uterus:  normal size, contour, position, consistency, mobility, non-tender and anteverted              Adnexa: no mass, fullness, tenderness               Chaperone was present for exam.  ASSESSMENT Dysuria, + urine dip for RBC and WBC, c/w UTI Post coital bleeding, no signs of vaginal lacerations    PLAN Treat with macrobid Pyridium for dysuria Return for a gyn ultrasound and possible endometrial biopsy with Dr Sabra Heck   An After Visit Summary was printed and given to the patient.

## 2015-02-16 NOTE — Patient Instructions (Signed)

## 2015-02-17 ENCOUNTER — Other Ambulatory Visit: Payer: Self-pay

## 2015-02-17 ENCOUNTER — Telehealth: Payer: Self-pay

## 2015-02-17 ENCOUNTER — Telehealth: Payer: Self-pay | Admitting: Obstetrics & Gynecology

## 2015-02-17 DIAGNOSIS — K6289 Other specified diseases of anus and rectum: Secondary | ICD-10-CM

## 2015-02-17 LAB — URINALYSIS, MICROSCOPIC ONLY
Casts: NONE SEEN [LPF]
Crystals: NONE SEEN [HPF]
Squamous Epithelial / LPF: NONE SEEN [HPF] (ref <=5)
WBC, UA: >60 WBC/HPF — AB (ref <=5)
Yeast: NONE SEEN [HPF]

## 2015-02-17 NOTE — Telephone Encounter (Signed)
EUS scheduled, pt instructed and medications reviewed.  Patient instructions mailed to home.  Patient to call with any questions or concerns.  

## 2015-02-17 NOTE — Telephone Encounter (Signed)
-----   Message from [name], MD sent at 02/17/2015  7:19 AM EST ----- Regarding: RE: submucosal rectal lesion - EUS? EUS is a good idea.  If not too deep I can proceed with snare resection.  I'll send to Vito Beg as well.     Bernadett Milian, She needs lower EUS, radial +/- linear, next available EUS Thursday, moderate sedation, case later in day.  Thanks   ----- Message -----    From: Gatha Mayer, MD    Sent: 02/16/2015  12:56 PM      To: [name], MD Subject: submucosal rectal lesion - EUS?                Please let me know what you think about EUS for this - new finding since last colonoscopy bxs not helpful as one might expect  Thanks  Glendell Docker

## 2015-02-17 NOTE — Progress Notes (Signed)
Quick Note:  Patient to have EUS of rectal nodule - bx not diagnostic Recall colon 5 yrs hx adenomas 2006 and 2011 - no letter ______

## 2015-02-17 NOTE — Telephone Encounter (Signed)
Spoke with patient. She would like to know if she will definitely need to have an endometrial biopsy. Advised PUS will be performed first and then based upon the results Dr.Miller will make further recommendations which may or may not include an endometrial biopsy. She is agreeable and verbalizes understanding. PUS with possible EMB scheduled for 03/03/2015 at 4 pm with 4:30 pm consult with Dr.Miller. She is agreeable to date and time.  Routing to provider for final review. Patient agreeable to disposition. Will close encounter.

## 2015-02-17 NOTE — Telephone Encounter (Signed)
Spoke with patient regarding benefits for recommended procedures. Patient has questions regarding the procedures requested and would like to speak with a nurse. Referred to Clinical Triage.

## 2015-02-18 ENCOUNTER — Telehealth: Payer: Self-pay | Admitting: Obstetrics & Gynecology

## 2015-02-18 LAB — URINE CULTURE: Colony Count: 75000

## 2015-02-18 NOTE — Telephone Encounter (Signed)
Final urine culture is not back yet.  The organism has not been typed and the antibiotic sensitivities are not ready.  Macrobid is an excellent choice for a UTI.  Let me know if the patient is not better.

## 2015-02-18 NOTE — Telephone Encounter (Signed)
Patient calling for recent lab results.

## 2015-02-18 NOTE — Telephone Encounter (Signed)
Call to patient. States she is feeling much better.  Advised of instructions from Dr Quincy Simmonds to continue Macrobid through the weekend pending final culture results. Patient states she did not take Pyridium today, advised patient this is ok, she may take this PRN. Patient is leaving for [place] on Sunday for 10 days. Advised that Dr Quincy Simmonds may be able to send message through [facility] Chart to advise her if antibiotics are correct or if she needs to change but unsure when this information will be available.  If she has already left on trip, she would at least be able to check message and if needs to change antibiotic, may need to see Urgent Care to get new antibiotic.

## 2015-02-18 NOTE — Telephone Encounter (Signed)
Patient was started on Macrobid 100 mg bid x 7 days on 02-16-2015 for UTI symptoms. Preliminary urine culture has returned showing 75,000 colonies and gram negative rods. Routing to Dr.Silva for review as Dr.Jertson is out of the office today.

## 2015-02-19 NOTE — Telephone Encounter (Signed)
Final urine culture result released to patient through My Chart.  Macrobid is appropriate.  Cc- Lamont Snowball

## 2015-02-21 NOTE — Telephone Encounter (Signed)
Patient read message 02-19-15 at 0800. Encounter closed.

## 2015-03-03 ENCOUNTER — Ambulatory Visit (INDEPENDENT_AMBULATORY_CARE_PROVIDER_SITE_OTHER): Payer: BLUE CROSS/BLUE SHIELD | Admitting: Obstetrics & Gynecology

## 2015-03-03 ENCOUNTER — Other Ambulatory Visit: Payer: Self-pay | Admitting: Obstetrics and Gynecology

## 2015-03-03 ENCOUNTER — Ambulatory Visit (INDEPENDENT_AMBULATORY_CARE_PROVIDER_SITE_OTHER): Payer: BLUE CROSS/BLUE SHIELD

## 2015-03-03 VITALS — BP 112/78 | Ht 64.0 in | Wt 110.0 lb

## 2015-03-03 DIAGNOSIS — R300 Dysuria: Secondary | ICD-10-CM | POA: Diagnosis not present

## 2015-03-03 DIAGNOSIS — N840 Polyp of corpus uteri: Secondary | ICD-10-CM

## 2015-03-03 DIAGNOSIS — N930 Postcoital and contact bleeding: Secondary | ICD-10-CM | POA: Diagnosis not present

## 2015-03-03 LAB — POCT URINALYSIS DIPSTICK
Bilirubin, UA: NEGATIVE
Blood, UA: NEGATIVE
Glucose, UA: NEGATIVE
Ketones, UA: NEGATIVE
Leukocytes, UA: NEGATIVE
Nitrite, UA: NEGATIVE
Protein, UA: NEGATIVE
Urobilinogen, UA: NEGATIVE
pH, UA: 5

## 2015-03-03 MED ORDER — HYDROCODONE-ACETAMINOPHEN 5-325 MG PO TABS: 1.0000 | ORAL_TABLET | Freq: Four times a day (QID) | ORAL | Status: DC | PRN

## 2015-03-03 NOTE — Progress Notes (Signed)
60 y.o. [id number] Divorcedfemale here for a pelvic ultrasound with sonohystogram due to PMP bleeding that is occuring primarily after intercourse.  The bleeding is just light and pink or red but has occurred several times.  She is here for possible biopsy as well.    Patient's last menstrual period was 01/01/2005.  Contraception: BTL  Technique:  Both transabdominal and transvaginal ultrasound examinations of the pelvis were performed. Transabdominal technique was performed for global imaging of the pelvis including uterus, ovaries, adnexal regions, and pelvic cul-de-sac.  It was necessary to proceed with endovaginal exam following the abdominal ultrasound transabdominal exam to visualize the endometrium and adnexa.  Color and duplex Doppler ultrasound was utilized to evaluate blood flow to the ovaries.    FINDINGS: Uterus: 8.0 x 5.0 x 3.7cm with 2.0cm, 1.7cm, 1.5cm, and 0.9cm fibroids Endometrium: 3.8 mm - 4.90mm Adnexa:  Left: 2.3 x 1.0 x 10.9cm     Right:  1.9 x 1.3 x 0.7cm Cul de sac: no free fluid  SHSG:  After obtaining appropriate verbal consent from patient, the cervix was visualized using a speculum, and prepped with betadine.  A tenaculum  was applied to the cervix.  Dilation of the cervix was necessary. The catheter was passed into the uterus and sterile saline introduced, with the following findings: 58mm filling defect was noted.  Due to presence of polyp and her bleeding, hysteroscopy was recommended.  Procedure discussed with patient.  Recovery and pain management discussed.  Risks discussed including but not limited to bleeding, rare risk of transfusion, infection, 1% risk of uterine perforation with risks of fluid deficit causing cardiac arrythmia, cerebral swelling and/or need to stop procedure early.  Fluid emboli and rare risk of death discussed.  DVT/PE, rare risk of risk of bowel/bladder/ureteral/vascular injury.  Patient aware if pathology abnormal she may need additional  treatment.   Pt had many, many questions that were addressed individually.   Ob Hx:   Patient's last menstrual period was 01/01/2005.          Sexually active: Yes.   Birth control: BTL Last pap: 8/26 neg with neg HR HPV Last MMG: 10/16 negative Tobacco: non smoker  Past Surgical History  Procedure Laterality Date  . Excision of ganglion cyst    . Dilation and curettage of uterus      MISSED AB  . Umbilical hernia repair      with BTL  . Colonoscopy w/ biopsies    . Esophagogastroduodenoscopy    . Flexible sigmoidoscopy    . Cosmetic surgery      Past Medical History  Diagnosis Date  . osteopenia   . Menopausal symptom   . Hemorrhoids   . Anemia   . IBS (irritable bowel syndrome)   . Insomnia   . Hyperlipidemia   . GERD (gastroesophageal reflux disease)   . Esophagitis   . Adenomatous colon polyp 12/12/2004  . Duodenitis     Allergies: Sulfonamide derivatives  Current Outpatient Prescriptions  Medication Sig Dispense Refill  . ALPRAZolam (XANAX) 0.5 MG tablet Take 1 tablet (0.5 mg total) by mouth at bedtime as needed. 30 tablet 0  . BABY ASPIRIN PO Take 81 mg by mouth daily.    . Calcium Carbonate-Vitamin D (CALCIUM + D PO) Take by mouth.    . estradiol-norethindrone (ACTIVELLA) 1-0.5 MG per tablet Take 1 tablet by mouth daily. 30 tablet 13  . fish oil-omega-3 fatty acids 1000 MG capsule Take 2 g by mouth daily.    Marland Kitchen  HYDROcodone-acetaminophen (NORCO/VICODIN) 5-325 MG tablet Take 1-2 tablets by mouth every 6 (six) hours as needed for moderate pain or severe pain. 12 tablet 0  . Hyoscyamine Sulfate (LEVSIN PO) Take by mouth as needed. Reported on 02/02/2015    . loperamide (IMODIUM) 2 MG capsule Take by mouth as needed for diarrhea or loose stools. Reported on 02/02/2015    . Multiple Vitamin (MULTIVITAMIN) capsule Take 1 capsule by mouth daily. Reported on 02/02/2015    . nitrofurantoin, macrocrystal-monohydrate, (MACROBID) 100 MG capsule Take one capsule BID x 7 days 14  capsule 0  . Omeprazole-Sodium Bicarbonate (ZEGERID OTC PO) Take by mouth. Reported on 02/02/2015    . phenazopyridine (PYRIDIUM) 200 MG tablet Take 1 tablet (200 mg total) by mouth 3 (three) times daily as needed for pain. 6 tablet 0   No current facility-administered medications for this visit.    ROS: Pertinent items noted in HPI and remainder of comprehensive ROS otherwise negative.  Exam:    BP 112/78 mmHg  Ht 5\' 4"  (1.626 m)  Wt 110 lb (49.896 kg)  BMI 18.87 kg/m2  LMP 01/01/2005  General appearance: alert and cooperative Head: Normocephalic, without obvious abnormality, atraumatic Neck: no adenopathy, supple, symmetrical, trachea midline and thyroid not enlarged, symmetric, no tenderness/mass/nodules Lungs: clear to auscultation bilaterally Heart: regular rate and rhythm, S1, S2 normal, no murmur, click, rub or gallop Abdomen: soft, non-tender; bowel sounds normal; no masses,  no organomegaly Extremities: extremities normal, atraumatic, no cyanosis or edema Skin: Skin color, texture, turgor normal. No rashes or lesions Neurologic: Grossly normal  Pelvic: External genitalia:  no lesions              Urethra: normal appearing urethra with no masses, tenderness or lesions              Bartholins and Skenes: normal                 Vagina: normal appearing vagina with normal color and discharge, no lesions              Cervix: normal appearance              Pap taken: No.        Bimanual Exam:  Uterus:  uterus is normal size, shape, consistency and nontender, enlarged to 8-10 wk's size                                      Adnexa:                                          Rectovaginal: Defnormal adnexa in size, nontender and no masseserred                                      Anus:  defer exam  A: Postmenopausal bleeding  Endometrial polyp noted    P:  Hysteroscopy with polyp resection planned Rx for Vicodoin given. Medications/Vitamins reviewed.  Pt knows needs to stop any  aspirin products Post-op instruction brochure given for pre and post op instructions.  ~55 minutes spent with patient >50% of time was in face to face discussion of above.

## 2015-03-07 ENCOUNTER — Encounter: Payer: Self-pay | Admitting: Obstetrics & Gynecology

## 2015-03-07 DIAGNOSIS — N930 Postcoital and contact bleeding: Secondary | ICD-10-CM | POA: Insufficient documentation

## 2015-03-07 DIAGNOSIS — N840 Polyp of corpus uteri: Secondary | ICD-10-CM | POA: Insufficient documentation

## 2015-03-09 ENCOUNTER — Telehealth: Payer: Self-pay | Admitting: Obstetrics & Gynecology

## 2015-03-09 NOTE — Telephone Encounter (Signed)
Call to patient. Left message to call back.  

## 2015-03-09 NOTE — Telephone Encounter (Signed)
Return call to patient. Desires to schedule procedure week of 03-28-15. Advised will confirm availability and call her back.

## 2015-03-09 NOTE — Telephone Encounter (Signed)
Patient calling to schedule a hysteroscopy.

## 2015-03-10 ENCOUNTER — Ambulatory Visit (HOSPITAL_COMMUNITY)
Admission: RE | Admit: 2015-03-10 | Discharge: 2015-03-10 | Disposition: A | Discharge status: Home / Self Care | Payer: BLUE CROSS/BLUE SHIELD | Source: Ambulatory Visit | Attending: Gastroenterology | Admitting: Gastroenterology

## 2015-03-10 ENCOUNTER — Encounter (HOSPITAL_COMMUNITY): Payer: Self-pay

## 2015-03-10 ENCOUNTER — Encounter (HOSPITAL_COMMUNITY)
Admission: RE | Disposition: A | Discharge status: Home / Self Care | Payer: Self-pay | Source: Ambulatory Visit | Attending: Gastroenterology

## 2015-03-10 ENCOUNTER — Other Ambulatory Visit: Payer: Self-pay | Admitting: Obstetrics & Gynecology

## 2015-03-10 DIAGNOSIS — Z79899 Other long term (current) drug therapy: Secondary | ICD-10-CM | POA: Diagnosis not present

## 2015-03-10 DIAGNOSIS — Z7982 Long term (current) use of aspirin: Secondary | ICD-10-CM | POA: Diagnosis not present

## 2015-03-10 DIAGNOSIS — C7A026 Malignant carcinoid tumor of the rectum: Secondary | ICD-10-CM | POA: Insufficient documentation

## 2015-03-10 DIAGNOSIS — N840 Polyp of corpus uteri: Secondary | ICD-10-CM | POA: Diagnosis not present

## 2015-03-10 DIAGNOSIS — N950 Postmenopausal bleeding: Secondary | ICD-10-CM | POA: Insufficient documentation

## 2015-03-10 DIAGNOSIS — K219 Gastro-esophageal reflux disease without esophagitis: Secondary | ICD-10-CM | POA: Insufficient documentation

## 2015-03-10 DIAGNOSIS — Z86010 Personal history of colonic polyps: Secondary | ICD-10-CM | POA: Diagnosis not present

## 2015-03-10 DIAGNOSIS — Z79891 Long term (current) use of opiate analgesic: Secondary | ICD-10-CM | POA: Diagnosis not present

## 2015-03-10 DIAGNOSIS — K6289 Other specified diseases of anus and rectum: Secondary | ICD-10-CM

## 2015-03-10 DIAGNOSIS — K629 Disease of anus and rectum, unspecified: Secondary | ICD-10-CM | POA: Diagnosis present

## 2015-03-10 DIAGNOSIS — Z79890 Hormone replacement therapy (postmenopausal): Secondary | ICD-10-CM | POA: Diagnosis not present

## 2015-03-10 HISTORY — PX: EUS: SHX5427

## 2015-03-10 SURGERY — ULTRASOUND, LOWER GI TRACT, ENDOSCOPIC
Anesthesia: Moderate Sedation

## 2015-03-10 MED ORDER — SODIUM CHLORIDE 0.9 % IV SOLN
INTRAVENOUS | Status: DC
  Administered 2015-03-10: 10:00:00 via INTRAVENOUS

## 2015-03-10 MED ORDER — MIDAZOLAM HCL 5 MG/ML IJ SOLN
INTRAMUSCULAR | Status: AC
  Filled 2015-03-10: qty 2

## 2015-03-10 MED ORDER — FENTANYL CITRATE (PF) 100 MCG/2ML IJ SOLN
INTRAMUSCULAR | Status: AC
  Filled 2015-03-10: qty 4

## 2015-03-10 MED ORDER — MIDAZOLAM HCL 10 MG/2ML IJ SOLN
INTRAMUSCULAR | Status: DC | PRN
  Administered 2015-03-10 (×4): 2 mg via INTRAVENOUS

## 2015-03-10 MED ORDER — FENTANYL CITRATE (PF) 100 MCG/2ML IJ SOLN
INTRAMUSCULAR | Status: DC | PRN
  Administered 2015-03-10 (×3): 25 ug via INTRAVENOUS

## 2015-03-10 NOTE — Telephone Encounter (Signed)
Patient returned call. Advised surgery scheduled for Friday 04-01-15 at 0830 at [facility]. Surgery instruction sheet reviewed with patient and printed copy will be mailed, see copy scanned to chart.   Routing to provider for final review. Patient agreeable to disposition. Will close encounter.

## 2015-03-10 NOTE — H&P (View-Only) (Signed)
60 y.o. [id number] Divorcedfemale here for a pelvic ultrasound with sonohystogram due to PMP bleeding that is occuring primarily after intercourse.  The bleeding is just light and pink or red but has occurred several times.  She is here for possible biopsy as well.    Patient's last menstrual period was 01/01/2005.  Contraception: BTL  Technique:  Both transabdominal and transvaginal ultrasound examinations of the pelvis were performed. Transabdominal technique was performed for global imaging of the pelvis including uterus, ovaries, adnexal regions, and pelvic cul-de-sac.  It was necessary to proceed with endovaginal exam following the abdominal ultrasound transabdominal exam to visualize the endometrium and adnexa.  Color and duplex Doppler ultrasound was utilized to evaluate blood flow to the ovaries.    FINDINGS: Uterus: 8.0 x 5.0 x 3.7cm with 2.0cm, 1.7cm, 1.5cm, and 0.9cm fibroids Endometrium: 3.8 mm - 4.30mm Adnexa:  Left: 2.3 x 1.0 x 10.9cm     Right:  1.9 x 1.3 x 0.7cm Cul de sac: no free fluid  SHSG:  After obtaining appropriate verbal consent from patient, the cervix was visualized using a speculum, and prepped with betadine.  A tenaculum  was applied to the cervix.  Dilation of the cervix was necessary. The catheter was passed into the uterus and sterile saline introduced, with the following findings: 37mm filling defect was noted.  Due to presence of polyp and her bleeding, hysteroscopy was recommended.  Procedure discussed with patient.  Recovery and pain management discussed.  Risks discussed including but not limited to bleeding, rare risk of transfusion, infection, 1% risk of uterine perforation with risks of fluid deficit causing cardiac arrythmia, cerebral swelling and/or need to stop procedure early.  Fluid emboli and rare risk of death discussed.  DVT/PE, rare risk of risk of bowel/bladder/ureteral/vascular injury.  Patient aware if pathology abnormal she may need additional  treatment.   Pt had many, many questions that were addressed individually.   Ob Hx:   Patient's last menstrual period was 01/01/2005.          Sexually active: Yes.   Birth control: BTL Last pap: 8/26 neg with neg HR HPV Last MMG: 10/16 negative Tobacco: non smoker  Past Surgical History  Procedure Laterality Date  . Excision of ganglion cyst    . Dilation and curettage of uterus      MISSED AB  . Umbilical hernia repair      with BTL  . Colonoscopy w/ biopsies    . Esophagogastroduodenoscopy    . Flexible sigmoidoscopy    . Cosmetic surgery      Past Medical History  Diagnosis Date  . osteopenia   . Menopausal symptom   . Hemorrhoids   . Anemia   . IBS (irritable bowel syndrome)   . Insomnia   . Hyperlipidemia   . GERD (gastroesophageal reflux disease)   . Esophagitis   . Adenomatous colon polyp 12/12/2004  . Duodenitis     Allergies: Sulfonamide derivatives  Current Outpatient Prescriptions  Medication Sig Dispense Refill  . ALPRAZolam (XANAX) 0.5 MG tablet Take 1 tablet (0.5 mg total) by mouth at bedtime as needed. 30 tablet 0  . BABY ASPIRIN PO Take 81 mg by mouth daily.    . Calcium Carbonate-Vitamin D (CALCIUM + D PO) Take by mouth.    . estradiol-norethindrone (ACTIVELLA) 1-0.5 MG per tablet Take 1 tablet by mouth daily. 30 tablet 13  . fish oil-omega-3 fatty acids 1000 MG capsule Take 2 g by mouth daily.    Marland Kitchen  HYDROcodone-acetaminophen (NORCO/VICODIN) 5-325 MG tablet Take 1-2 tablets by mouth every 6 (six) hours as needed for moderate pain or severe pain. 12 tablet 0  . Hyoscyamine Sulfate (LEVSIN PO) Take by mouth as needed. Reported on 02/02/2015    . loperamide (IMODIUM) 2 MG capsule Take by mouth as needed for diarrhea or loose stools. Reported on 02/02/2015    . Multiple Vitamin (MULTIVITAMIN) capsule Take 1 capsule by mouth daily. Reported on 02/02/2015    . nitrofurantoin, macrocrystal-monohydrate, (MACROBID) 100 MG capsule Take one capsule BID x 7 days 14  capsule 0  . Omeprazole-Sodium Bicarbonate (ZEGERID OTC PO) Take by mouth. Reported on 02/02/2015    . phenazopyridine (PYRIDIUM) 200 MG tablet Take 1 tablet (200 mg total) by mouth 3 (three) times daily as needed for pain. 6 tablet 0   No current facility-administered medications for this visit.    ROS: Pertinent items noted in HPI and remainder of comprehensive ROS otherwise negative.  Exam:    BP 112/78 mmHg  Ht 5\' 4"  (1.626 m)  Wt 110 lb (49.896 kg)  BMI 18.87 kg/m2  LMP 01/01/2005  General appearance: alert and cooperative Head: Normocephalic, without obvious abnormality, atraumatic Neck: no adenopathy, supple, symmetrical, trachea midline and thyroid not enlarged, symmetric, no tenderness/mass/nodules Lungs: clear to auscultation bilaterally Heart: regular rate and rhythm, S1, S2 normal, no murmur, click, rub or gallop Abdomen: soft, non-tender; bowel sounds normal; no masses,  no organomegaly Extremities: extremities normal, atraumatic, no cyanosis or edema Skin: Skin color, texture, turgor normal. No rashes or lesions Neurologic: Grossly normal  Pelvic: External genitalia:  no lesions              Urethra: normal appearing urethra with no masses, tenderness or lesions              Bartholins and Skenes: normal                 Vagina: normal appearing vagina with normal color and discharge, no lesions              Cervix: normal appearance              Pap taken: No.        Bimanual Exam:  Uterus:  uterus is normal size, shape, consistency and nontender, enlarged to 8-10 wk's size                                      Adnexa:                                          Rectovaginal: Defnormal adnexa in size, nontender and no masseserred                                      Anus:  defer exam  A: Postmenopausal bleeding  Endometrial polyp noted    P:  Hysteroscopy with polyp resection planned Rx for Vicodoin given. Medications/Vitamins reviewed.  Pt knows needs to stop any  aspirin products Post-op instruction brochure given for pre and post op instructions.  ~55 minutes spent with patient >50% of time was in face to face discussion of above.

## 2015-03-10 NOTE — Discharge Instructions (Signed)

## 2015-03-10 NOTE — Telephone Encounter (Signed)
Patient is returning a call to [place]. She can be reached after 3:30 today.

## 2015-03-10 NOTE — Op Note (Addendum)
[facility] Patient Name: Valerie Hubbard Procedure Date: 03/10/2015 MRN: [id number] Attending MD: [name] , MD Date of Birth: 09/22/1955 CSN:  Age: 60 Admit Type: Outpatient Account #: [id number] Procedure:                Lower EUS and flexible sigmoidoscopy with snare                            removal of nodule Indications:              Rectal deformity found on endoscopy; subepithelial                            tumor versus extrinsic compression Providers:                [name], MD, Cleda Daub, RN, Alfonso Patten, Technician Referring MD:             Gatha Mayer, MD Medicines:                Fentanyl 75 micrograms IV, Midazolam 8 mg IV Complications:            No immediate complications. Estimated Blood Loss:     Estimated blood loss: none. Procedure:                Pre-Anesthesia Assessment:                           - Prior to the procedure, a History and Physical                            was performed, and patient medications and                            allergies were reviewed. The patient's tolerance of                            previous anesthesia was also reviewed. The risks                            and benefits of the procedure and the sedation                            options and risks were discussed with the patient.                            All questions were answered, and informed consent                            was obtained. Prior Anticoagulants: The patient has                            taken no previous anticoagulant or antiplatelet  agents. ASA Grade Assessment: I - A normal, healthy                            patient. After reviewing the risks and benefits,                            the patient was deemed in satisfactory condition to                            undergo the procedure.                           After obtaining informed consent, the endoscope was                            passed under direct vision. Throughout the                            procedure, the patient's blood pressure, pulse, and                            oxygen saturations were monitored continuously. The                            [id number]) scope was introduced through                            the anus and advanced to the the rectosigmoid                            junction for ultrasound. The lower EUS was                            accomplished without difficulty. The patient                            tolerated the procedure well. The quality of the                            bowel preparation was good. Scope In: 11:08:49 AM Scope Out: 11:22:15 AM Scope Withdrawal Time: 0 hours 2 minutes 43 seconds  Total Procedure Duration: 0 hours 13 minutes 26 seconds  Findings:      Sigmoidoscopic findings:      1. Distal rectal submucosal nodule, solid, measured about 1cm across,       located 1-2cm from the anal verge.      2. Otherwise normal      EUS findings:      1. The nodule above correlated with a hypoechoic, round, hypoechoic mass       that involves the deep mucosa, submucosal layer. There is no obvious       muscularis propria involvement.      2. No perirectal adenopathy.      Following EUS examination I used snare/cautery technique to apparently       completely remove the nodule. Impression:               -  Sigmoidoscopic findings:                           1. Distal rectal submucosal nodule, solid, measured                            about 1cm across, located 1-2cm from the anal verge.                           2. Otherwise normal                           EUS findings:                           1. The nodule above correlated with a hypoechoic,                            round, hypoechoic mass that involves the deep                            mucosa, submucosal layer. There is no obvious                            muscularis propria  involvement.                           2. No perirectal adenopathy.                           Following EUS examination I used snare/cautery                            technique to apparently completely remove the                            nodule. This is most suspicious for a small                            carcinoid lesion. Moderate Sedation:      N/A- Per Anesthesia Care Recommendation:           - Await path results. Procedure Code(s):        --- Professional ---                           [phone], Sigmoidoscopy, flexible; with endoscopic                            ultrasound examination Diagnosis Code(s):        --- Professional ---                           K62.89, Other specified diseases of anus and rectum CPT copyright 2016 American Medical Association. All rights reserved. The codes documented in this report are preliminary and upon coder review may  be revised to meet current compliance requirements. [name], MD Melene Plan  Ardis Hughs, MD 03/10/2015 11:36:19 AM Number of Addenda: 1 Addendum Number: 1   Addendum Date: 03/10/2015 1:10:30 PM      scope [id number] was also used [name], MD [name], MD 03/10/2015 1:10:53 PM

## 2015-03-10 NOTE — Interval H&P Note (Signed)
History and Physical Interval Note:  03/10/2015 10:52 AM  Valerie Hubbard  has presented today for surgery, with the diagnosis of rectal mass  The various methods of treatment have been discussed with the patient and family. After consideration of risks, benefits and other options for treatment, the patient has consented to  Procedure(s): LOWER ENDOSCOPIC ULTRASOUND (EUS) (N/A) as a surgical intervention .  The patient's history has been reviewed, patient examined, no change in status, stable for surgery.  I have reviewed the patient's chart and labs.  Questions were answered to the patient's satisfaction.     [name]

## 2015-03-11 ENCOUNTER — Telehealth: Payer: Self-pay | Admitting: Obstetrics & Gynecology

## 2015-03-11 ENCOUNTER — Telehealth: Payer: Self-pay | Admitting: Gastroenterology

## 2015-03-11 NOTE — Telephone Encounter (Signed)
Patient wants to see if she can have March 29th instead of the 31st for her appointment.

## 2015-03-11 NOTE — Telephone Encounter (Signed)
Return call to patient. States she cant have surgery on 04-01-15 due to hair appointment. Advised 03-30-15 is not available. Was able to move surgery to Thursday, 03-31-15 at [facility]. Patient agreeable to new date and time, aware of new location. Brochure on [facility] mailed to patient.  Routing to provider for final review. Patient agreeable to disposition. Will close encounter.

## 2015-03-11 NOTE — Telephone Encounter (Signed)
Patient wanting to speak with Gay Filler. Best # to reach: [phone]

## 2015-03-11 NOTE — Telephone Encounter (Signed)
Pt states she is having a lot of gas since her procedure yesterday and wants to know what she can take. Discussed with pt that she could try taking gasx or phazyme for the gas. Pt verbalized understanding.

## 2015-03-14 ENCOUNTER — Other Ambulatory Visit: Payer: Self-pay | Admitting: Internal Medicine

## 2015-03-15 ENCOUNTER — Telehealth: Payer: Self-pay | Admitting: Gastroenterology

## 2015-03-15 ENCOUNTER — Encounter: Payer: Self-pay | Admitting: Internal Medicine

## 2015-03-15 NOTE — Telephone Encounter (Signed)
The pt was left a message on her cell, she is in [place] and there is a 6 hour time difference.  She was notified to call with any questions she may have.  She was notified that the rectal lesion was considered a cure.

## 2015-03-16 ENCOUNTER — Encounter (HOSPITAL_COMMUNITY): Payer: Self-pay | Admitting: Gastroenterology

## 2015-03-21 ENCOUNTER — Telehealth: Payer: Self-pay | Admitting: Obstetrics & Gynecology

## 2015-03-21 NOTE — Telephone Encounter (Signed)
Return call to patient. She states she has been out of the country and wanted to confirm date change of surgery from 04/01/15 to 03/31/15. Advised that is correct that her procedure has been moved to 03/31/15 at [facility]. Patient advised she will need to arrive at 0600.  Patient states she is unsure if she has a ride home, she is going to check with family. She is advised she will need someone to take her home after procedure and she will be unable to drive for 24 hours. Patient verbalized understanding and will call back with any questions.  Routing to provider for final review. Patient agreeable to disposition. Will close encounter.

## 2015-03-21 NOTE — Telephone Encounter (Signed)
Patient has questions regarding her upcoming surgery.

## 2015-03-24 ENCOUNTER — Telehealth: Payer: Self-pay | Admitting: Internal Medicine

## 2015-03-24 NOTE — Telephone Encounter (Signed)
Patient has questions about how often her colon recall should be?

## 2015-03-25 ENCOUNTER — Encounter (HOSPITAL_BASED_OUTPATIENT_CLINIC_OR_DEPARTMENT_OTHER): Payer: Self-pay | Admitting: *Deleted

## 2015-03-25 NOTE — Telephone Encounter (Signed)
Patient notified

## 2015-03-25 NOTE — Telephone Encounter (Signed)
Colonoscopy recall in 5 yrs as planned - 2022 Rectal carcinoid is out and does not require a repeat exam

## 2015-03-28 ENCOUNTER — Encounter (HOSPITAL_BASED_OUTPATIENT_CLINIC_OR_DEPARTMENT_OTHER): Payer: Self-pay | Admitting: *Deleted

## 2015-03-28 ENCOUNTER — Telehealth: Payer: Self-pay | Admitting: Obstetrics & Gynecology

## 2015-03-28 NOTE — Progress Notes (Signed)
NPO AFTER MN.  ARRIVE AT 0600.  NEEDS HG.  WILL TAKE ZEGERID AM DOS W/ SIPS OF WATER.

## 2015-03-28 NOTE — Telephone Encounter (Signed)
Agree with your advice to pt.  Thanks.  OK to close encounter.

## 2015-03-28 NOTE — Telephone Encounter (Signed)
Return call to patient. She states she is no longer having any bleeding after intercourse.  The only bleeding she has experienced was after she became sexually active after long period of abstinence. Wants to be sure Dr Sabra Heck is aware bleeding issue has resolved to be sure this procedure is still needed. Advised that PMB, regardless of amount warrants full evaluation. Ultrasound has shown thickened endometrial lining and polyp and removal of polyp with D&C to evaluate for abnormal cells would be recommended. Advised that Dr Sabra Heck will review call and be notified that bleeding has stopped. If Dr Sabra Heck has any changes to these recommendations, we will call her back.  Routing to provider for final review.

## 2015-03-28 NOTE — Telephone Encounter (Signed)
Patient is scheduled for surgery for this Thursday 03/31/15 with Dr. Sabra Heck. Patient says she is no longer having bleeding with intercourse and is wondering if surgery is still needed? Best # to reach: [phone]

## 2015-03-30 ENCOUNTER — Encounter: Payer: Self-pay | Admitting: Obstetrics and Gynecology

## 2015-03-30 ENCOUNTER — Telehealth: Payer: Self-pay | Admitting: *Deleted

## 2015-03-30 ENCOUNTER — Ambulatory Visit (INDEPENDENT_AMBULATORY_CARE_PROVIDER_SITE_OTHER): Payer: BLUE CROSS/BLUE SHIELD | Admitting: Obstetrics and Gynecology

## 2015-03-30 VITALS — BP 100/62 | HR 58 | Resp 14 | Wt 109.0 lb

## 2015-03-30 DIAGNOSIS — L293 Anogenital pruritus, unspecified: Secondary | ICD-10-CM | POA: Diagnosis not present

## 2015-03-30 MED ORDER — BETAMETHASONE VALERATE 0.1 % EX OINT: TOPICAL_OINTMENT | CUTANEOUS | Status: DC

## 2015-03-30 NOTE — Progress Notes (Signed)
Patient ID: Valerie Hubbard, female   DOB: 02/19/1955, 60 y.o.   MRN: [id number] GYNECOLOGY  VISIT   HPI: 60 y.o.   Divorced  Caucasian  female   [phone] with Patient's last menstrual period was 01/01/2005.   here c/o vaginal itching. The itching started 3 days ago, mild, no burning, no irritation. No abnormal d/c. She is supposed to have a hysteroscopy, D&C tomorrow.  She has been sexually active, using coconut oil.   GYNECOLOGIC HISTORY: Patient's last menstrual period was 01/01/2005. Contraception:postmenopause  Menopausal hormone therapy: Estradiol         OB History    Gravida Para Term Preterm AB TAB SAB Ectopic Multiple Living   3 2 2  1     2          Patient Active Problem List   Diagnosis Date Noted  . Endometrial polyp 03/07/2015  . Postcoital bleeding 03/07/2015  . Insomnia   . Low bone mass   . Menopausal symptom   . Hyperlipidemia 02/18/2007  . GERD 02/18/2007  . IBS 02/18/2007  . FIBROCYSTIC BREAST DISEASE 02/18/2007  . COLONIC POLYPS, ADENOMATOUS 12/12/2004  . Internal hemorrhoids 12/12/2004    Past Medical History  Diagnosis Date  . osteopenia   . IBS (irritable bowel syndrome)   . Hyperlipidemia   . GERD (gastroesophageal reflux disease)   . 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 History of esophagitis     and duodenitis   12/ 2006  . History of adenomatous polyp of colon     multiple  . PMB (postmenopausal bleeding)   . Endometrial polyp   . Wears glasses     Past Surgical History  Procedure Laterality Date  . Umbilical hernia repair  1995    with Bilateral Tubal Ligation  . Esophagogastroduodenoscopy  12-12-2004  . Eus N/A 03/10/2015    Procedure: LOWER ENDOSCOPIC ULTRASOUND (EUS);  Surgeon: [name], MD;  Location: [facility];  Service: Endoscopy;  Laterality: N/A;  . Wrist ganglion excision Left 10-04-2000  . Colonoscopy  last one 02-11-2015  . Breast enhancement surgery  Oct 2016  . Belpharoptosis repair Left 2016     upper and lower   . Dilation and curettage of uterus  1985    w/ suction  . Tonsillectomy  as child    Current Outpatient Prescriptions  Medication Sig Dispense Refill  . ALPRAZolam (XANAX) 0.5 MG tablet Take 1 tablet (0.5 mg total) by mouth at bedtime as needed. 30 tablet 0  . aspirin EC 81 MG tablet Take 81 mg by mouth daily.    . Calcium Carbonate-Vitamin D (CALCIUM + D PO) Take 2 tablets by mouth 2 (two) times daily.     Marland Kitchen estradiol-norethindrone (ACTIVELLA) 1-0.5 MG per tablet Take 1 tablet by mouth daily. (Patient taking differently: Take 0.5 tablets by mouth every evening. ) 30 tablet 13  . fish oil-omega-3 fatty acids 1000 MG capsule Take 2 g by mouth daily.    Marland Kitchen Hyoscyamine Sulfate (LEVSIN PO) Take by mouth as needed. Reported on 02/02/2015    . loperamide (IMODIUM) 2 MG capsule Take by mouth as needed for diarrhea or loose stools. Reported on 02/02/2015    . Multiple Vitamin (MULTIVITAMIN) capsule Take 1 capsule by mouth 2 (two) times daily. Reported on 02/02/2015    . Omeprazole-Sodium Bicarbonate (ZEGERID OTC PO) Take 1 capsule by mouth as needed. Reported on 02/02/2015    . HYDROcodone-acetaminophen (NORCO/VICODIN) 5-325 MG  tablet Take 1-2 tablets by mouth every 6 (six) hours as needed for moderate pain or severe pain. (Patient not taking: Reported on 03/30/2015) 12 tablet 0   No current facility-administered medications for this visit.     ALLERGIES: Sulfa antibiotics  Family History  Problem Relation Age of Onset  . Breast cancer Mother   . Hypertension Mother   . Osteoporosis Mother   . Colon cancer Maternal Grandfather   . Colon cancer Maternal Aunt   . Colon cancer Paternal Aunt   . COPD Father   . Lung cancer Father   . Colon polyps Father   . Colon polyps Sister   . Colon polyps Brother     Social History   Social History  . Marital Status: Divorced    Spouse Name: N/A  . Number of Children: N/A  . Years of Education: N/A   Occupational History  . Not on  file.   Social History Main Topics  . Smoking status: Never Smoker   . Smokeless tobacco: Never Used  . Alcohol Use: 5.4 - 9.6 oz/week    7-14 Glasses of wine, 2 Standard drinks or equivalent per week  . Drug Use: No  . Sexual Activity: Not on file     Comment: BTL   Other Topics Concern  . Not on file   Social History Narrative    Review of Systems  Constitutional: Negative.   HENT: Negative.   Eyes: Negative.   Respiratory: Negative.   Cardiovascular: Negative.   Gastrointestinal: Negative.   Genitourinary:       Vaginal itching   Musculoskeletal: Negative.   Skin: Negative.   Neurological: Negative.   Endo/Heme/Allergies: Negative.   Psychiatric/Behavioral: Negative.     PHYSICAL EXAMINATION:    BP 100/62 mmHg  Pulse 58  Resp 14  Wt 109 lb (49.442 kg)  LMP 01/01/2005    General appearance: alert, cooperative and appears stated age  Pelvic: External genitalia:  no lesions, slight erythema              Urethra:  normal appearing urethra with no masses, tenderness or lesions              Bartholins and Skenes: normal                 Vagina: mildly atrophic vaginal mucosa with a slight increase in white creamy vaginal discharge              Cervix:no lesions  Chaperone was present for exam.  Wet prep: no clue, no trich, + wbc KOH: no yeast PH: 4.5   ASSESSMENT Genital pruritus, negative vaginal slides    PLAN Wet prep probe (will notify Dr Sabra Heck) Steroid ointment for itching Use Vaseline externally as needed Discussed vulvar skin care   An After Visit Summary was printed and given to the patient.

## 2015-03-30 NOTE — Telephone Encounter (Signed)
Call to patient. She is scheduled for office visit today with Dr. Talbert Nan at 1245 for evaluation of vaginal itching.

## 2015-03-30 NOTE — Telephone Encounter (Signed)
Offer appointment today for office visit.

## 2015-03-30 NOTE — Telephone Encounter (Signed)
Patient wanting to speak to nurse she has some questions about procedure that she is having tomorrow. Best # to reach: [phone]

## 2015-03-30 NOTE — Telephone Encounter (Signed)
Call to patient. She is scheduled for Dilatation and Curettage Hysteroscopy with Myosure for post menopausal bleeding tomorrow with Dr. Sabra Heck.   She states on Monday she began to have some external vaginal itching. She denies vaginal discharge. No abdominal pain or fevers. She states "I have been keeping an eye on this and I don't know if it is all in my head or not." Patient states she has not had a yeast infection previously. Symptoms are not increasing per patient.   Advised patient will review with Dr. Quincy Simmonds (Covering for Dr. Sabra Heck) and will return call with any instructions.

## 2015-03-31 ENCOUNTER — Encounter (HOSPITAL_BASED_OUTPATIENT_CLINIC_OR_DEPARTMENT_OTHER): Payer: Self-pay

## 2015-03-31 ENCOUNTER — Encounter (HOSPITAL_BASED_OUTPATIENT_CLINIC_OR_DEPARTMENT_OTHER)
Admission: RE | Disposition: A | Discharge status: Home / Self Care | Payer: Self-pay | Source: Ambulatory Visit | Attending: Obstetrics & Gynecology

## 2015-03-31 ENCOUNTER — Ambulatory Visit (HOSPITAL_BASED_OUTPATIENT_CLINIC_OR_DEPARTMENT_OTHER): Payer: BLUE CROSS/BLUE SHIELD | Admitting: Anesthesiology

## 2015-03-31 ENCOUNTER — Ambulatory Visit (HOSPITAL_BASED_OUTPATIENT_CLINIC_OR_DEPARTMENT_OTHER)
Admission: RE | Admit: 2015-03-31 | Discharge: 2015-03-31 | Disposition: A | Discharge status: Home / Self Care | Payer: BLUE CROSS/BLUE SHIELD | Source: Ambulatory Visit | Attending: Obstetrics & Gynecology | Admitting: Obstetrics & Gynecology

## 2015-03-31 DIAGNOSIS — N840 Polyp of corpus uteri: Secondary | ICD-10-CM | POA: Insufficient documentation

## 2015-03-31 DIAGNOSIS — N950 Postmenopausal bleeding: Secondary | ICD-10-CM | POA: Diagnosis not present

## 2015-03-31 DIAGNOSIS — Z79899 Other long term (current) drug therapy: Secondary | ICD-10-CM | POA: Insufficient documentation

## 2015-03-31 DIAGNOSIS — Z79890 Hormone replacement therapy (postmenopausal): Secondary | ICD-10-CM | POA: Insufficient documentation

## 2015-03-31 DIAGNOSIS — K219 Gastro-esophageal reflux disease without esophagitis: Secondary | ICD-10-CM | POA: Diagnosis not present

## 2015-03-31 DIAGNOSIS — Z7982 Long term (current) use of aspirin: Secondary | ICD-10-CM | POA: Insufficient documentation

## 2015-03-31 HISTORY — PX: DILATATION & CURETTAGE/HYSTEROSCOPY WITH MYOSURE: SHX6511

## 2015-03-31 HISTORY — DX: Presence of spectacles and contact lenses: Z97.3

## 2015-03-31 HISTORY — DX: Personal history of colonic polyps: Z86.010

## 2015-03-31 HISTORY — DX: Polyp of corpus uteri: N84.0

## 2015-03-31 HISTORY — DX: Postmenopausal bleeding: N95.0

## 2015-03-31 HISTORY — DX: Personal history of other diseases of the digestive system: Z87.19

## 2015-03-31 HISTORY — DX: Personal history of adenomatous and serrated colon polyps: Z86.0101

## 2015-03-31 LAB — WET PREP BY MOLECULAR PROBE
Candida species: NEGATIVE
Gardnerella vaginalis: NEGATIVE
Trichomonas vaginosis: NEGATIVE

## 2015-03-31 LAB — POCT HEMOGLOBIN-HEMACUE: Hemoglobin: 15.1 g/dL — ABNORMAL HIGH (ref 12.0–15.0)

## 2015-03-31 SURGERY — DILATATION & CURETTAGE/HYSTEROSCOPY WITH MYOSURE
Anesthesia: General

## 2015-03-31 MED ORDER — PROMETHAZINE HCL 25 MG/ML IJ SOLN
6.2500 mg | INTRAMUSCULAR | Status: DC | PRN
  Filled 2015-03-31: qty 1

## 2015-03-31 MED ORDER — PROPOFOL 10 MG/ML IV BOLUS
INTRAVENOUS | Status: AC
  Filled 2015-03-31: qty 40

## 2015-03-31 MED ORDER — KETOROLAC TROMETHAMINE 30 MG/ML IJ SOLN
INTRAMUSCULAR | Status: DC | PRN
  Administered 2015-03-31: 30 mg via INTRAVENOUS

## 2015-03-31 MED ORDER — LIDOCAINE-EPINEPHRINE (PF) 1 %-1:200000 IJ SOLN
INTRAMUSCULAR | Status: DC | PRN
  Administered 2015-03-31: 10 mL

## 2015-03-31 MED ORDER — DEXAMETHASONE SODIUM PHOSPHATE 4 MG/ML IJ SOLN
INTRAMUSCULAR | Status: DC | PRN
  Administered 2015-03-31: 5 mg via INTRAVENOUS

## 2015-03-31 MED ORDER — PROPOFOL 10 MG/ML IV BOLUS
INTRAVENOUS | Status: DC | PRN
  Administered 2015-03-31: 150 mg via INTRAVENOUS

## 2015-03-31 MED ORDER — LIDOCAINE HCL (CARDIAC) 20 MG/ML IV SOLN
INTRAVENOUS | Status: AC
  Filled 2015-03-31: qty 5

## 2015-03-31 MED ORDER — ONDANSETRON HCL 4 MG/2ML IJ SOLN
INTRAMUSCULAR | Status: AC
  Filled 2015-03-31: qty 2

## 2015-03-31 MED ORDER — FENTANYL CITRATE (PF) 100 MCG/2ML IJ SOLN
INTRAMUSCULAR | Status: DC | PRN
  Administered 2015-03-31: 50 ug via INTRAVENOUS

## 2015-03-31 MED ORDER — SODIUM CHLORIDE 0.9 % IR SOLN
Status: DC | PRN
  Administered 2015-03-31: 1000 mL

## 2015-03-31 MED ORDER — HYDROCODONE-ACETAMINOPHEN 5-325 MG PO TABS
1.0000 | ORAL_TABLET | Freq: Four times a day (QID) | ORAL | Status: DC | PRN
  Administered 2015-03-31: 1 via ORAL
  Filled 2015-03-31: qty 1

## 2015-03-31 MED ORDER — LIDOCAINE HCL (CARDIAC) 20 MG/ML IV SOLN
INTRAVENOUS | Status: DC | PRN
  Administered 2015-03-31: 60 mg via INTRAVENOUS

## 2015-03-31 MED ORDER — LACTATED RINGERS IV SOLN
INTRAVENOUS | Status: DC
  Administered 2015-03-31: 07:00:00 via INTRAVENOUS
  Filled 2015-03-31: qty 1000

## 2015-03-31 MED ORDER — HYDROCODONE-ACETAMINOPHEN 5-325 MG PO TABS
ORAL_TABLET | ORAL | Status: AC
  Filled 2015-03-31: qty 1

## 2015-03-31 MED ORDER — FENTANYL CITRATE (PF) 100 MCG/2ML IJ SOLN
INTRAMUSCULAR | Status: AC
  Filled 2015-03-31: qty 4

## 2015-03-31 MED ORDER — DEXAMETHASONE SODIUM PHOSPHATE 10 MG/ML IJ SOLN
INTRAMUSCULAR | Status: AC
  Filled 2015-03-31: qty 1

## 2015-03-31 MED ORDER — MIDAZOLAM HCL 2 MG/2ML IJ SOLN
INTRAMUSCULAR | Status: AC
  Filled 2015-03-31: qty 2

## 2015-03-31 MED ORDER — HYDROMORPHONE HCL 1 MG/ML IJ SOLN
0.2500 mg | INTRAMUSCULAR | Status: DC | PRN
  Filled 2015-03-31: qty 1

## 2015-03-31 MED ORDER — MIDAZOLAM HCL 5 MG/5ML IJ SOLN
INTRAMUSCULAR | Status: DC | PRN
  Administered 2015-03-31: 1.5 mg via INTRAVENOUS

## 2015-03-31 MED ORDER — KETOROLAC TROMETHAMINE 30 MG/ML IJ SOLN
INTRAMUSCULAR | Status: AC
  Filled 2015-03-31: qty 1

## 2015-03-31 MED ORDER — LACTATED RINGERS IV SOLN
INTRAVENOUS | Status: DC | PRN
  Administered 2015-03-31: 08:00:00 via INTRAVENOUS

## 2015-03-31 MED ORDER — ONDANSETRON HCL 4 MG/2ML IJ SOLN
INTRAMUSCULAR | Status: DC | PRN
  Administered 2015-03-31: 4 mg via INTRAVENOUS

## 2015-03-31 SURGICAL SUPPLY — 28 items
CANISTER SUCTION 2500CC (MISCELLANEOUS) ×4 IMPLANT
CATH ROBINSON RED A/P 16FR (CATHETERS) ×2 IMPLANT
CORD ACTIVE DISPOSABLE (ELECTRODE)
CORD ELECTRO ACTIVE DISP (ELECTRODE) IMPLANT
COVER BACK TABLE 60X90IN (DRAPES) IMPLANT
DEVICE MYOSURE LITE (MISCELLANEOUS) ×2 IMPLANT
DRAPE LG THREE QUARTER DISP (DRAPES) ×2 IMPLANT
DRSG TELFA 3X8 NADH (GAUZE/BANDAGES/DRESSINGS) ×2 IMPLANT
ELECT LOOP GYNE PRO 24FR (CUTTING LOOP)
ELECT REM PT RETURN 9FT ADLT (ELECTROSURGICAL) ×2
ELECT VAPORTRODE GRVD BAR (ELECTRODE) IMPLANT
ELECTRODE LOOP GYNE PRO 24FR (CUTTING LOOP) IMPLANT
ELECTRODE REM PT RTRN 9FT ADLT (ELECTROSURGICAL) ×1 IMPLANT
GLOVE ECLIPSE 6.5 STRL STRAW (GLOVE) ×4 IMPLANT
GLOVE INDICATOR 7.0 STRL GRN (GLOVE) ×2 IMPLANT
GOWN W/COTTON TOWEL STD LRG (GOWNS) ×2 IMPLANT
GOWN XL W/COTTON TOWEL STD (GOWNS) ×2 IMPLANT
KIT ROOM TURNOVER WOR (KITS) ×2 IMPLANT
LEGGING LITHOTOMY PAIR STRL (DRAPES) ×2 IMPLANT
MANIFOLD NEPTUNE II (INSTRUMENTS) IMPLANT
PACK BASIN DAY SURGERY FS (CUSTOM PROCEDURE TRAY) ×2 IMPLANT
PAD OB MATERNITY 4.3X12.25 (PERSONAL CARE ITEMS) IMPLANT
PAD PREP 24X48 CUFFED NSTRL (MISCELLANEOUS) ×2 IMPLANT
SEAL ROD LENS SCOPE MYOSURE (ABLATOR) ×2 IMPLANT
TOWEL OR 17X24 6PK STRL BLUE (TOWEL DISPOSABLE) ×4 IMPLANT
TRAY DSU PREP LF (CUSTOM PROCEDURE TRAY) ×2 IMPLANT
TUBE CONNECTING 12X1/4 (SUCTIONS) IMPLANT
WATER STERILE IRR 500ML POUR (IV SOLUTION) IMPLANT

## 2015-03-31 NOTE — Transfer of Care (Signed)
Immediate Anesthesia Transfer of Care Note  Patient: Valerie Hubbard  Procedure(s) Performed: Procedure(s) with comments: DILATATION & CURETTAGE/HYSTEROSCOPY WITH MYOSURE (N/A) - endometrial polyp  Patient Location: PACU  Anesthesia Type:General  Level of Consciousness: awake, alert  and oriented  Airway & Oxygen Therapy: Patient Spontanous Breathing and Patient connected to nasal cannula oxygen  Post-op Assessment: Report given to RN and Post -op Vital signs reviewed and stable  Post vital signs: Reviewed and stable  Last Vitals:  Filed Vitals:   03/31/15 0608  BP: 116/76  Pulse: 69  Temp: 36.6 C  Resp: 16    Complications: No apparent anesthesia complications

## 2015-03-31 NOTE — Anesthesia Postprocedure Evaluation (Signed)
Anesthesia Post Note  Patient: Valerie Hubbard  Procedure(s) Performed: Procedure(s) (LRB): DILATATION & CURETTAGE/HYSTEROSCOPY WITH MYOSURE (N/A)  Patient location during evaluation: PACU Anesthesia Type: General Level of consciousness: awake Pain management: pain level controlled Respiratory status: spontaneous breathing Cardiovascular status: stable Anesthetic complications: no    Last Vitals:  Filed Vitals:   03/31/15 0608 03/31/15 0821  BP: 116/76 125/74  Pulse: 69 59  Temp: 36.6 C 36.8 C  Resp: 16 12    Last Pain: There were no vitals filed for this visit.               EDWARDS,Lendy Dittrich

## 2015-03-31 NOTE — H&P (Signed)
Valerie Hubbard is an 60 y.o. female [phone] DWF here for hysteroscopy with polyp resection, D&C, possible myosure due to PMP bleeding and endometrial polyp.  Pt has been counseled about conservative management vs excision of the polyp.  Risks and benefits have been discussed.  Pt is here and ready to proceed.  Pertinent Gynecological History: Menses: post-menopausal Bleeding: post menopausal bleeding Contraception: tubal ligation DES exposure: denies Blood transfusions: none Sexually transmitted diseases: no past history Previous GYN Procedures: none  Last mammogram: normal Date: 2016 Last pap: normal Date: 2016 OB History: G3, P2   Menstrual History: Patient's last menstrual period was 01/01/2005.    Past Medical History  Diagnosis Date  . osteopenia   . IBS (irritable bowel syndrome)   . Hyperlipidemia   . GERD (gastroesophageal reflux disease)   . 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 History of esophagitis     and duodenitis   12/ 2006  . History of adenomatous polyp of colon     multiple  . PMB (postmenopausal bleeding)   . Endometrial polyp   . Wears glasses     Past Surgical History  Procedure Laterality Date  . Umbilical hernia repair  1995    with Bilateral Tubal Ligation  . Esophagogastroduodenoscopy  12-12-2004  . Eus N/A 03/10/2015    Procedure: LOWER ENDOSCOPIC ULTRASOUND (EUS);  Surgeon: [name], MD;  Location: [facility];  Service: Endoscopy;  Laterality: N/A;  . Wrist ganglion excision Left 10-04-2000  . Colonoscopy  last one 02-11-2015  . Breast enhancement surgery  Oct 2016  . Belpharoptosis repair Left 2016    upper and lower   . Dilation and curettage of uterus  1985    w/ suction  . Tonsillectomy  as child    Family History  Problem Relation Age of Onset  . Breast cancer Mother   . Hypertension Mother   . Osteoporosis Mother   . Colon cancer Maternal Grandfather   . Colon cancer Maternal Aunt   . Colon cancer  Paternal Aunt   . COPD Father   . Lung cancer Father   . Colon polyps Father   . Colon polyps Sister   . Colon polyps Brother     Social History:  reports that she has never smoked. She has never used smokeless tobacco. She reports that she drinks about 5.4 - 9.6 oz of alcohol per week. She reports that she does not use illicit drugs.  Allergies:  Allergies  Allergen Reactions  . Sulfa Antibiotics Other (See Comments)    Unknown childhood reaction    Prescriptions prior to admission  Medication Sig Dispense Refill Last Dose  . ALPRAZolam (XANAX) 0.5 MG tablet Take 1 tablet (0.5 mg total) by mouth at bedtime as needed. 30 tablet 0 03/26/2015 at 2200  . aspirin EC 81 MG tablet Take 81 mg by mouth daily.   Taking  . betamethasone valerate ointment (VALISONE) 0.1 % Apply a pea sized amount topically BID x 1-2 weeks 15 g 0 03/30/2015 at 1500  . Calcium Carbonate-Vitamin D (CALCIUM + D PO) Take 2 tablets by mouth 2 (two) times daily.    03/30/2015 at 0600  . estradiol-norethindrone (ACTIVELLA) 1-0.5 MG per tablet Take 1 tablet by mouth daily. (Patient taking differently: Take 0.5 tablets by mouth every evening. ) 30 tablet 13 03/30/2015 at 0600  . fish oil-omega-3 fatty acids 1000 MG capsule Take 2 g by mouth daily.   Taking  .  Hyoscyamine Sulfate (LEVSIN PO) Take by mouth as needed. Reported on 02/02/2015   Past Month at Unknown time  . loperamide (IMODIUM) 2 MG capsule Take by mouth as needed for diarrhea or loose stools. Reported on 02/02/2015   Past Month at Unknown time  . Multiple Vitamin (MULTIVITAMIN) capsule Take 1 capsule by mouth 2 (two) times daily. Reported on 02/02/2015   03/30/2015 at 0600  . Omeprazole-Sodium Bicarbonate (ZEGERID OTC PO) Take 1 capsule by mouth as needed. Reported on 02/02/2015   03/31/2015 at 0500  . HYDROcodone-acetaminophen (NORCO/VICODIN) 5-325 MG tablet Take 1-2 tablets by mouth every 6 (six) hours as needed for moderate pain or severe pain. (Patient not taking:  Reported on 03/30/2015) 12 tablet 0 Unknown at Unknown time    Review of Systems  All other systems reviewed and are negative.   Blood pressure 116/76, pulse 69, temperature 97.9 F (36.6 C), temperature source Oral, resp. rate 16, height 5' 4.5" (1.638 m), weight 107 lb 8 oz (48.762 kg), last menstrual period 01/01/2005, SpO2 100 %. Physical Exam  Constitutional: She is oriented to person, place, and time. She appears well-developed and well-nourished.  Cardiovascular: Normal rate and regular rhythm.   Respiratory: Effort normal and breath sounds normal.  Neurological: She is alert and oriented to person, place, and time.  Skin: Skin is warm and dry.  Psychiatric: She has a normal mood and affect.    Results for orders placed or performed in visit on 03/30/15 (from the past 24 hour(s))  WET PREP BY MOLECULAR PROBE     Status: None   Collection Time: 03/30/15  1:08 PM  Result Value Ref Range   Candida species NEG Negative   Trichomonas vaginosis NEG Negative   Gardnerella vaginalis NEG Negative   Narrative   Performed at:  [place], [id number]                [place], [place] [address]    No results found.  Assessment/Plan: 60 yo G3P2 DWF here for hysteroscopy, polyp resection, D&C due to endometrial polyp and PMP bleeding.  All questions answered.  Pt is here and ready to proceed.  Valerie Hubbard, Valerie Hubbard 03/31/2015, 7:09 AM

## 2015-03-31 NOTE — Discharge Instructions (Signed)
Post-surgical Instructions, Outpatient Surgery  You may expect to feel dizzy, weak, and drowsy for as long as 24 hours after receiving the medicine that made you sleep (anesthetic). For the first 24 hours after your surgery:    Do not drive a car, ride a bicycle, participate in physical activities, or take public transportation until you are done taking narcotic pain medicines or as directed by Dr. Sabra Heck.   Do not drink alcohol or take tranquilizers.   Do not take medicine that has not been prescribed by your physicians.   Do not sign important papers or make important decisions while on narcotic pain medicines.   Have a responsible person with you.   PAIN MANAGEMENT  Motrin 800mg .  (This is the same as 4-200mg  over the counter tablets of Motrin or ibuprofen.)  You may take this every eight hours or as needed for cramping.    Vicodin 5/325mg .  For more severe pain, take one or two tablets every four to six hours as needed for pain control.  (Remember that narcotic pain medications increase your risk of constipation.  If this becomes a problem, you may take an over the counter stool softener like Colace 100mg  up to four times a day.)  DO'S AND DON'T'S  Do not take a tub bath for four days.  You may shower on the first day after your surgery  Do not do any heavy lifting for one to two weeks.  This increases the chance of bleeding.  Do move around as you feel able.  Stairs are fine.  You may begin to exercise again as you feel able.  Do not lift any weights for one week as this can increase your bleeding.  Do not put anything in the vagina for two weeks--no tampons, intercourse, or douching.    REGULAR MEDIATIONS/VITAMINS:  You may restart all of your regular medications as prescribed.  You may restart all of your vitamins as you normally take them.    PLEASE CALL OR SEEK MEDICAL CARE IF:  You have persistent nausea and vomiting.   You have trouble eating or drinking.   You  have an oral temperature above 100.5.   You have constipation that is not helped by adjusting diet or increasing fluid intake. Pain medicines are a common cause of constipation.   You have heavy vaginal bleeding   Post Anesthesia Home Care Instructions  Activity: Get plenty of rest for the remainder of the day. A responsible adult should stay with you for 24 hours following the procedure.  For the next 24 hours, DO NOT: -Drive a car -Paediatric nurse -Drink alcoholic beverages -Take any medication unless instructed by your physician -Make any legal decisions or sign important papers.  Meals: Start with liquid foods such as gelatin or soup. Progress to regular foods as tolerated. Avoid greasy, spicy, heavy foods. If nausea and/or vomiting occur, drink only clear liquids until the nausea and/or vomiting subsides. Call your physician if vomiting continues.  Special Instructions/Symptoms: Your throat may feel dry or sore from the anesthesia or the breathing tube placed in your throat during surgery. If this causes discomfort, gargle with warm salt water. The discomfort should disappear within 24 hours.  If you had a scopolamine patch placed behind your ear for the management of post- operative nausea and/or vomiting:  1. The medication in the patch is effective for 72 hours, after which it should be removed.  Wrap patch in a tissue and discard in the trash. Wash  thoroughly with soap and water. 2. You may remove the patch earlier than 72 hours if you experience unpleasant side effects which may include dry mouth, dizziness or visual disturbances. 3. Avoid touching the patch. Wash your hands with soap and water after contact with the patch.    

## 2015-03-31 NOTE — Op Note (Signed)
03/31/2015  8:18 AM  PATIENT:  Valerie Hubbard  60 y.o. female  PRE-OPERATIVE DIAGNOSIS:  PMB, endometrial polyp  POST-OPERATIVE DIAGNOSIS:  PMB, endometrial polyp  PROCEDURE:  Procedure(s): DILATATION & CURETTAGE/HYSTEROSCOPY WITH MYOSURE  SURGEON:  Phillip Maffei, [name]  ASSISTANTS: OR staff   ANESTHESIA:   general  ESTIMATED BLOOD LOSS: 10cc  BLOOD ADMINISTERED:none   FLUIDS: 1000cc LR  UOP: 20cc clear UOP  SPECIMEN:  Endometrial curetting and polyp  DISPOSITION OF SPECIMEN:  PATHOLOGY  FINDINGS: endometrial polyp in left cornua and thin endometrium  DESCRIPTION OF OPERATION: Patient was taken to the operating room.  She is placed in the supine position. SCDs were on her lower extremities and functioning properly. General anesthesia with an LMA was administered without difficulty.   Legs were then placed in the [place] in the low lithotomy position. The legs were lifted to the high lithotomy position and the Betadine prep was used on the inner thighs perineum and vagina x3. Patient was draped in a normal standard fashion. An in and out catheterization with a red rubber Foley catheter was performed. Approximately 20 cc of clear urine was noted. A bivalve speculum was placed the vagina. The anterior lip of the cervix was grasped with single-tooth tenaculum.  A paracervical block of 1% lidocaine mixed one-to-one with epinephrine (1:100,000 units).  10 cc was used total. The cervix is dilated up to #23 [facility] dilators. The endometrial cavity sounded to 7 cm.   A 2.9 millimeter diagnostic hysteroscope was obtained. Saline was used as the hysteroscopic fluid. The hysteroscope was advanced through the endocervical canal into the endometrial cavity. The tubal ostia were noted bilaterally. There was a polyp in the left cornual region.  Endometrium was thin.  Myosure lite obtained.  The polyp was resected.  The hysteroscope was removed.  Curettage with #1 toothed curette was performed  until a rough gritty texture was noted in all quadrants.  With revisualization of the hysteroscope the polyp was remove and cavity now with post-curette appearance.  At this point, no other procedure was needed and the procedure was ended. The hysteroscope was removed. The fluid deficit was 125 cc. The tenaculum was removed from the anterior lip of the cervix. The speculum was removed from the vagina. The prep was cleansed of the patient's skin. The legs are positioned back in the supine position. Sponge, lap, needle, initially counts were correct x2. Patient was given Toradol 30mg  IV before leaving the OR.  Patient was taken to recovery in stable condition.  COUNTS:  YES  PLAN OF CARE: Transfer to PACU

## 2015-03-31 NOTE — Anesthesia Preprocedure Evaluation (Signed)
Anesthesia Evaluation  Patient identified by MRN, date of birth, ID band Patient awake    Reviewed: Allergy & Precautions, NPO status , reviewed documented beta blocker date and time   Airway Mallampati: II  TM Distance: >3 FB Neck ROM: Full    Dental   Pulmonary neg pulmonary ROS,    breath sounds clear to auscultation       Cardiovascular negative cardio ROS   Rhythm:Regular Rate:Normal     Neuro/Psych    GI/Hepatic Neg liver ROS, GERD  ,  Endo/Other    Renal/GU negative Renal ROS     Musculoskeletal   Abdominal   Peds  Hematology   Anesthesia Other Findings   Reproductive/Obstetrics                             Anesthesia Physical Anesthesia Plan  ASA: II  Anesthesia Plan: General   Post-op Pain Management:    Induction: Intravenous  Airway Management Planned: LMA  Additional Equipment:   Intra-op Plan:   Post-operative Plan: Extubation in OR  Informed Consent: I have reviewed the patients History and Physical, chart, labs and discussed the procedure including the risks, benefits and alternatives for the proposed anesthesia with the patient or authorized representative who has indicated his/her understanding and acceptance.   Dental advisory given  Plan Discussed with: CRNA and Anesthesiologist  Anesthesia Plan Comments:         Anesthesia Quick Evaluation

## 2015-03-31 NOTE — Anesthesia Procedure Notes (Signed)
Procedure Name: MAC Date/Time: 03/31/2015 7:39 AM Performed by: Rayvon Char Pre-anesthesia Checklist: Patient identified, Emergency Drugs available, Suction available, Patient being monitored and Timeout performed Patient Re-evaluated:Patient Re-evaluated prior to inductionOxygen Delivery Method: Circle system utilized Intubation Type: IV induction Ventilation: Mask ventilation without difficulty LMA: LMA inserted LMA Size: 3.0 Number of attempts: 1

## 2015-04-01 ENCOUNTER — Encounter (HOSPITAL_BASED_OUTPATIENT_CLINIC_OR_DEPARTMENT_OTHER): Payer: Self-pay | Admitting: Obstetrics & Gynecology

## 2015-04-04 ENCOUNTER — Telehealth: Payer: Self-pay

## 2015-04-04 NOTE — Telephone Encounter (Signed)
Spoke with patient at time of incoming call. Patient had a D&C hysteroscopy on 03/31/2015 with Dr.Miller. States she spoke with Dr.Miller yesterday and was doing well. Reports she woke up this morning and is experiencing "moderate" bleeding. "Up until today I was just having spotting, but this is different. I had a gush of blood this morning when I woke up." Reports since waking up she has changed her pad twice. States she is experiencing cramping that "is not severe." Denies any sharp pain. Took Ibuprofen 30 minutes ago with some relief. Denies any fever or chills. Patient is leaving to go out of the country today at 2 pm. Advised I will speak with Dr.Miller regarding her symptoms and return call with further recommendations. She is agreeable.

## 2015-04-04 NOTE — Telephone Encounter (Addendum)
Call to patient. States bleeding is "steady" but darker. Changing pad for the third time but more for hygiene, pads are not soaked.  Patient is leaving on a flight today at 2pm so declines appointment to be checked. States she will call with update from [place] before actually leaving the country if bleeding is any heavier. Advised can take Motrin as needed every 6-8 hours for cramping. Monitor and observe. Seek emergency care if soaking a pad per hour, becomes, dizzy, lightheaded or SOB. Advised can always call, MD on call 24/7 to review status.  Patient will call next week when returns home to provide update.  Routing to provider for final review.

## 2015-04-06 ENCOUNTER — Encounter (HOSPITAL_BASED_OUTPATIENT_CLINIC_OR_DEPARTMENT_OTHER): Payer: Self-pay | Admitting: Obstetrics & Gynecology

## 2015-04-14 ENCOUNTER — Encounter: Payer: Self-pay | Admitting: Obstetrics & Gynecology

## 2015-04-14 ENCOUNTER — Ambulatory Visit (INDEPENDENT_AMBULATORY_CARE_PROVIDER_SITE_OTHER): Payer: BLUE CROSS/BLUE SHIELD | Admitting: Obstetrics & Gynecology

## 2015-04-14 VITALS — BP 108/70 | HR 70 | Resp 16 | Ht 64.0 in | Wt 110.0 lb

## 2015-04-14 DIAGNOSIS — N840 Polyp of corpus uteri: Secondary | ICD-10-CM

## 2015-04-14 NOTE — Progress Notes (Signed)
Post Operative Visit  Procedure: DILATATION & CURETTAGE/HYSTEROSCOPY WITH MYOSURE Days Post-op: 14  Subjective: Doing well.  Had a week of vaginal bleeding after procedure that was moderated.  Has stopped now.  Never needed anything for pain.  Denies fever.  Voids are normal.  Pathology and images reviewed.  Objective: LMP 01/01/2005  EXAM General: alert and cooperative GI: soft, non-tender; bowel sounds normal; no masses,  no organomegaly Extremities: extremities normal, atraumatic, no cyanosis or edema Vaginal Bleeding: none  GYN:  NAEFG, vaginal pink and moist, no lesions, cervix close, no CMT  Assessment: s/p hysteroscopy with polyp resection, D&C  Plan: Pt knows to call with any future bleeding.  AEX scheduled for 12/17.

## 2015-05-03 ENCOUNTER — Ambulatory Visit (INDEPENDENT_AMBULATORY_CARE_PROVIDER_SITE_OTHER): Payer: BLUE CROSS/BLUE SHIELD | Admitting: Internal Medicine

## 2015-05-03 ENCOUNTER — Encounter: Payer: Self-pay | Admitting: Internal Medicine

## 2015-05-03 VITALS — BP 114/80 | HR 68 | Ht 64.0 in | Wt 110.4 lb

## 2015-05-03 DIAGNOSIS — K648 Other hemorrhoids: Secondary | ICD-10-CM | POA: Insufficient documentation

## 2015-05-03 HISTORY — DX: Other hemorrhoids: K64.8

## 2015-05-03 NOTE — Progress Notes (Addendum)
    Here for treatment of bleeding hemorrhoids.  Anoscopy was performed with the patient in the left lateral decubitus position while a chaperone was present and revealed Grade 2 RP RA and LL internal hemorrhoids. Marland Kitchen  PROCEDURE NOTE: The patient presents with symptomatic grade 2  hemorrhoids, requesting rubber band ligation of his/her hemorrhoidal disease.  All risks, benefits and alternative forms of therapy were described and informed consent was obtained.   The anorectum was pre-medicated with 0.125% NTG and 5% lidocaine The decision was made to band the RP internal hemorrhoid, and the [place] was used to perform band ligation without complication.  Digital anorectal examination was then performed to assure proper positioning of the band, and to adjust the banded tissue as required.  The patient was discharged home without pain or other issues.  Dietary and behavioral recommendations were given and along with follow-up instructions.     The following adjunctive treatments were recommended:  Will discuss prn ondansetron use for IBS-D when she returns    The patient will return in about 2 weeks for  follow-up and possible additional banding as required. No complications were encountered and the patient tolerated the procedure well.  [id number] DAVID, MD

## 2015-05-03 NOTE — Assessment & Plan Note (Signed)
RP banded - RTC 2 weeks approx rebanding

## 2015-05-03 NOTE — Patient Instructions (Signed)
  HEMORRHOID BANDING PROCEDURE    FOLLOW-UP CARE   1. The procedure you have had should have been relatively painless since the banding of the area involved does not have nerve endings and there is no pain sensation.  The rubber band cuts off the blood supply to the hemorrhoid and the band may fall off as soon as 48 hours after the banding (the band may occasionally be seen in the toilet bowl following a bowel movement). You may notice a temporary feeling of fullness in the rectum which should respond adequately to plain Tylenol or Motrin.  2. Following the banding, avoid strenuous exercise that evening and resume full activity the next day.  A sitz bath (soaking in a warm tub) or bidet is soothing, and can be useful for cleansing the area after bowel movements.     3. To avoid constipation, take two tablespoons of natural wheat bran, natural oat bran, flax, Benefiber or any over the counter fiber supplement and increase your water intake to 7-8 glasses daily.    4. Unless you have been prescribed anorectal medication, do not put anything inside your rectum for two weeks: No suppositories, enemas, fingers, etc.  5. Occasionally, you may have more bleeding than usual after the banding procedure.  This is often from the untreated hemorrhoids rather than the treated one.  Don't be concerned if there is a tablespoon or so of blood.  If there is more blood than this, lie flat with your bottom higher than your head and apply an ice pack to the area. If the bleeding does not stop within a half an hour or if you feel faint, call our office at [phone] or go to the emergency room.  6. Problems are not common; however, if there is a substantial amount of bleeding, severe pain, chills, fever or difficulty passing urine (very rare) or other problems, you should call us at [phone] or report to the nearest emergency room.  7. Do not stay seated continuously for more than 2-3 hours for a day or  two after the procedure.  Tighten your buttock muscles 10-15 times every two hours and take 10-15 deep breaths every 1-2 hours.  Do not spend more than a few minutes on the toilet if you cannot empty your bowel; instead re-visit the toilet at a later time.    We will see you at your next visit on 06/14/15 at 2:15pm    I appreciate the opportunity to care for you.

## 2015-05-05 ENCOUNTER — Telehealth: Payer: Self-pay | Admitting: Obstetrics & Gynecology

## 2015-05-05 NOTE — Telephone Encounter (Signed)
Patient called and said, "I need to speak with the nurse. I'm having some itching I'd like something called in for." Patient declined an appointment. Pharmacy on file is correct.

## 2015-05-05 NOTE — Telephone Encounter (Signed)
I returned patients call and left a voice mail. I did let patient know that our office is closing due to an emergency and she may not be able to get back through. -eh

## 2015-05-05 NOTE — Telephone Encounter (Signed)
Spoke with pt personally. She is having mild itching, not sure if it is from a wipe she is using. She has valisone ointment. She will try this over the next few days. If she isn't feeling better next week she will come in.

## 2015-06-14 ENCOUNTER — Ambulatory Visit (INDEPENDENT_AMBULATORY_CARE_PROVIDER_SITE_OTHER): Payer: BLUE CROSS/BLUE SHIELD | Admitting: Internal Medicine

## 2015-06-14 ENCOUNTER — Encounter: Payer: Self-pay | Admitting: Internal Medicine

## 2015-06-14 VITALS — BP 110/62 | HR 80 | Ht 64.5 in | Wt 109.1 lb

## 2015-06-14 DIAGNOSIS — K648 Other hemorrhoids: Secondary | ICD-10-CM

## 2015-06-14 NOTE — Progress Notes (Signed)
Patient ID: Valerie Hubbard, female   DOB: May 16, 1955, 60 y.o.   MRN: [id number]  PROCEDURE NOTE: The patient presents with symptomatic grade 2 hemorrhoids, requesting rubber band ligation of his/her hemorrhoidal disease.  All risks, benefits and alternative forms of therapy were described and informed consent was obtained.   The anorectum was pre-medicated with 0.125% NTG and 5% lidocaine The decision was made to band the RA and LL internal hemorrhoid, and the [place] was used to perform band ligation without complication.  Digital anorectal examination was then performed to assure proper positioning of the band, and to adjust the banded tissue as required.  The patient was discharged home without pain or other issues.  Dietary and behavioral recommendations were given and along with follow-up instructions.     The following adjunctive treatments were recommended:  Imodium AD prn  The patient will return as needed for  follow-up and possible additional banding as required. No complications were encountered and the patient tolerated the procedure well.  I appreciate the opportunity to care for this patient. [id number] DAVID, MD

## 2015-06-14 NOTE — Assessment & Plan Note (Signed)
RA and LL banded RTC prn 

## 2015-06-14 NOTE — Patient Instructions (Signed)
   I hope things continue to go well and even better!  HEMORRHOID BANDING PROCEDURE    FOLLOW-UP CARE   1. The procedure you have had should have been relatively painless since the banding of the area involved does not have nerve endings and there is no pain sensation.  The rubber band cuts off the blood supply to the hemorrhoid and the band may fall off as soon as 48 hours after the banding (the band may occasionally be seen in the toilet bowl following a bowel movement). You may notice a temporary feeling of fullness in the rectum which should respond adequately to plain Tylenol or Motrin.  2. Following the banding, avoid strenuous exercise that evening and resume full activity the next day.  A sitz bath (soaking in a warm tub) or bidet is soothing, and can be useful for cleansing the area after bowel movements.     3. To avoid constipation, take two tablespoons of natural wheat bran, natural oat bran, flax, Benefiber or any over the counter fiber supplement and increase your water intake to 7-8 glasses daily.    4. Unless you have been prescribed anorectal medication, do not put anything inside your rectum for two weeks: No suppositories, enemas, fingers, etc.  5. Occasionally, you may have more bleeding than usual after the banding procedure.  This is often from the untreated hemorrhoids rather than the treated one.  Don't be concerned if there is a tablespoon or so of blood.  If there is more blood than this, lie flat with your bottom higher than your head and apply an ice pack to the area. If the bleeding does not stop within a half an hour or if you feel faint, call our office at [phone] or go to the emergency room.  6. Problems are not common; however, if there is a substantial amount of bleeding, severe pain, chills, fever or difficulty passing urine (very rare) or other problems, you should call us at [phone] or report to the nearest emergency room.  7. Do not  stay seated continuously for more than 2-3 hours for a day or two after the procedure.  Tighten your buttock muscles 10-15 times every two hours and take 10-15 deep breaths every 1-2 hours.  Do not spend more than a few minutes on the toilet if you cannot empty your bowel; instead re-visit the toilet at a later time.

## 2015-07-18 ENCOUNTER — Encounter: Payer: Self-pay | Admitting: Physician Assistant

## 2015-08-02 ENCOUNTER — Encounter: Payer: Self-pay | Admitting: Physician Assistant

## 2015-08-25 ENCOUNTER — Encounter: Payer: Self-pay | Admitting: Physician Assistant

## 2015-08-25 ENCOUNTER — Ambulatory Visit (INDEPENDENT_AMBULATORY_CARE_PROVIDER_SITE_OTHER): Payer: BLUE CROSS/BLUE SHIELD | Admitting: Physician Assistant

## 2015-08-25 VITALS — BP 120/72 | HR 71 | Temp 97.3°F | Resp 16 | Ht 64.5 in | Wt 109.8 lb

## 2015-08-25 DIAGNOSIS — D126 Benign neoplasm of colon, unspecified: Secondary | ICD-10-CM

## 2015-08-25 DIAGNOSIS — Z0001 Encounter for general adult medical examination with abnormal findings: Secondary | ICD-10-CM | POA: Diagnosis not present

## 2015-08-25 DIAGNOSIS — K2100 Gastro-esophageal reflux disease with esophagitis, without bleeding: Secondary | ICD-10-CM

## 2015-08-25 DIAGNOSIS — Z79899 Other long term (current) drug therapy: Secondary | ICD-10-CM

## 2015-08-25 DIAGNOSIS — Z0000 Encounter for general adult medical examination without abnormal findings: Secondary | ICD-10-CM

## 2015-08-25 DIAGNOSIS — R6889 Other general symptoms and signs: Secondary | ICD-10-CM | POA: Diagnosis not present

## 2015-08-25 DIAGNOSIS — N951 Menopausal and female climacteric states: Secondary | ICD-10-CM

## 2015-08-25 DIAGNOSIS — E559 Vitamin D deficiency, unspecified: Secondary | ICD-10-CM

## 2015-08-25 DIAGNOSIS — K648 Other hemorrhoids: Secondary | ICD-10-CM | POA: Diagnosis not present

## 2015-08-25 DIAGNOSIS — N6019 Diffuse cystic mastopathy of unspecified breast: Secondary | ICD-10-CM | POA: Diagnosis not present

## 2015-08-25 DIAGNOSIS — E785 Hyperlipidemia, unspecified: Secondary | ICD-10-CM

## 2015-08-25 DIAGNOSIS — G4700 Insomnia, unspecified: Secondary | ICD-10-CM

## 2015-08-25 DIAGNOSIS — M8580 Other specified disorders of bone density and structure, unspecified site: Secondary | ICD-10-CM

## 2015-08-25 DIAGNOSIS — I10 Essential (primary) hypertension: Secondary | ICD-10-CM | POA: Diagnosis not present

## 2015-08-25 DIAGNOSIS — Z1389 Encounter for screening for other disorder: Secondary | ICD-10-CM

## 2015-08-25 DIAGNOSIS — Z136 Encounter for screening for cardiovascular disorders: Secondary | ICD-10-CM | POA: Diagnosis not present

## 2015-08-25 DIAGNOSIS — K581 Irritable bowel syndrome with constipation: Secondary | ICD-10-CM

## 2015-08-25 LAB — CBC WITH DIFFERENTIAL/PLATELET
Basophils Absolute: 134 {cells}/uL (ref 0–200)
Basophils Relative: 2 %
Eosinophils Absolute: 134 {cells}/uL (ref 15–500)
Eosinophils Relative: 2 %
HCT: 44.7 % (ref 35.0–45.0)
Hemoglobin: 15.3 g/dL (ref 11.7–15.5)
Lymphocytes Relative: 34 %
Lymphs Abs: 2278 {cells}/uL (ref 850–3900)
MCH: 32.8 pg (ref 27.0–33.0)
MCHC: 34.2 g/dL (ref 32.0–36.0)
MCV: 95.9 fL (ref 80.0–100.0)
MPV: 9.3 fL (ref 7.5–12.5)
Monocytes Absolute: 603 {cells}/uL (ref 200–950)
Monocytes Relative: 9 %
Neutro Abs: 3551 {cells}/uL (ref 1500–7800)
Neutrophils Relative %: 53 %
Platelets: 315 K/uL (ref 140–400)
RBC: 4.66 MIL/uL (ref 3.80–5.10)
RDW: 13.9 % (ref 11.0–15.0)
WBC: 6.7 K/uL (ref 3.8–10.8)

## 2015-08-25 LAB — HEPATIC FUNCTION PANEL
ALT: 16 U/L (ref 6–29)
AST: 18 U/L (ref 10–35)
Albumin: 4.8 g/dL (ref 3.6–5.1)
Alkaline Phosphatase: 37 U/L (ref 33–130)
Bilirubin, Direct: 0.1 mg/dL (ref <=0.2)
Indirect Bilirubin: 0.6 mg/dL (ref 0.2–1.2)
Total Bilirubin: 0.7 mg/dL (ref 0.2–1.2)
Total Protein: 7.5 g/dL (ref 6.1–8.1)

## 2015-08-25 LAB — BASIC METABOLIC PANEL WITH GFR
BUN: 9 mg/dL (ref 7–25)
CO2: 27 mmol/L (ref 20–31)
Calcium: 9.7 mg/dL (ref 8.6–10.4)
Chloride: 102 mmol/L (ref 98–110)
Creat: 0.74 mg/dL (ref 0.50–1.05)
GFR, Est African American: >89 mL/min (ref >=60)
GFR, Est Non African American: 89 mL/min (ref >=60)
Glucose, Bld: 87 mg/dL (ref 65–99)
Potassium: 4 mmol/L (ref 3.5–5.3)
Sodium: 140 mmol/L (ref 135–146)

## 2015-08-25 LAB — LIPID PANEL
Cholesterol: 250 mg/dL — ABNORMAL HIGH (ref 125–200)
HDL: 119 mg/dL (ref >=46)
LDL Cholesterol: 121 mg/dL (ref <130)
Total CHOL/HDL Ratio: 2.1 Ratio (ref <=5.0)
Triglycerides: 52 mg/dL (ref <150)
VLDL: 10 mg/dL (ref <30)

## 2015-08-25 LAB — MAGNESIUM: Magnesium: 2.1 mg/dL (ref 1.5–2.5)

## 2015-08-25 MED ORDER — HYOSCYAMINE SULFATE 0.125 MG PO TABS: 0.1250 mg | ORAL_TABLET | Freq: Four times a day (QID) | ORAL | 2 refills | Status: AC | PRN

## 2015-08-25 NOTE — Progress Notes (Signed)
Complete Physical  Assessment and Plan: Low bone mass -     VITAMIN D 25 Hydroxy (Vit-D Deficiency, Fractures)  Menopausal symptom  Hyperlipidemia -     CBC with Differential/Platelet -     BASIC METABOLIC PANEL WITH GFR -     Hepatic function panel -     TSH -     Lipid panel -     EKG 12-Lead  Insomnia  Diffuse cystic mastopathy, unspecified laterality  Benign neoplasm of colon, unspecified part of colon  Gastroesophageal reflux disease with esophagitis Continue PPI  Irritable bowel syndrome with diarrhea -     hyoscyamine (LEVSIN) 0.125 MG tablet; Take 1 tablet (0.125 mg total) by mouth every 6 (six) hours as needed for cramping.  Internal bleeding hemorrhoids - Gr 2  Routine general medical examination at a health care facility -     CBC with Differential/Platelet -     BASIC METABOLIC PANEL WITH GFR -     Hepatic function panel -     TSH -     Lipid panel -     Magnesium -     VITAMIN D 25 Hydroxy (Vit-D Deficiency, Fractures) -     Urinalysis, Routine w reflex microscopic (not at [facility]) -     Microalbumin / creatinine urine ratio -     EKG 12-Lead  Medication management -     Magnesium  Screening for hematuria or proteinuria -     Urinalysis, Routine w reflex microscopic (not at [facility]) -     Microalbumin / creatinine urine ratio   Discussed med's effects and SE's. Screening labs and tests as requested with regular follow-up as recommended.  HPI 60 y.o. female  presents for a complete physical.  Her blood pressure has been controlled at home, today their BP is BP: 120/72 She does workout, walks. She denies chest pain, shortness of breath, dizziness.  She is on cholesterol medication and denies myalgias. Her cholesterol is at goal. The cholesterol last visit was:   Lab Results  Component Value Date   CHOL 207 (H) 07/15/2014   HDL 101 07/15/2014   LDLCALC 98 07/15/2014   TRIG 41 07/15/2014   CHOLHDL 2.0 07/15/2014   Last A1C in the office was:  5.6 She is on minivelle and bASA, will get MGM in Oct Patient is on Vitamin D supplement.   Lab Results  Component Value Date   VD25OH 59 07/15/2014   Flutters were better with zegrid.  Has some anxiety, rarely takes xanax, has IBS, follows with Dr. Carlean Purl She states her job is stressful, she is traveling 2-3 weeks out of the month.   Current Medications:  Current Outpatient Prescriptions on File Prior to Visit  Medication Sig Dispense Refill  . ALPRAZolam (XANAX) 0.5 MG tablet Take 1 tablet (0.5 mg total) by mouth at bedtime as needed. 30 tablet 0  . aspirin EC 81 MG tablet Take 81 mg by mouth daily.    Marland Kitchen estradiol-norethindrone (ACTIVELLA) 1-0.5 MG per tablet Take 1 tablet by mouth daily. (Patient taking differently: Take 0.5 tablets by mouth every evening. ) 30 tablet 13  . fish oil-omega-3 fatty acids 1000 MG capsule Take 2 g by mouth daily.    Marland Kitchen Hyoscyamine Sulfate (LEVSIN PO) Take by mouth as needed. Reported on 02/02/2015    . loperamide (IMODIUM) 2 MG capsule Take by mouth as needed for diarrhea or loose stools. Reported on 02/02/2015    . Multiple Vitamin (MULTIVITAMIN) capsule Take  1 capsule by mouth 2 (two) times daily. Reported on 02/02/2015    . Omeprazole-Sodium Bicarbonate (ZEGERID OTC PO) Take 1 capsule by mouth as needed. Reported on 02/02/2015     No current facility-administered medications on file prior to visit.    Health Maintenance:   Immunization History  Administered Date(s) Administered  . Pneumococcal Polysaccharide-23 01/02/2003  . Td 04/15/2003  . Tdap 07/14/2013   TDAP: 2015 Pneumovax: 1997 Prevnar 13 due at 65 Flu vaccine: N/A Zostavax: N/A  Pap: 2015 at OB/GYN 3D MGM: 10/2014 CAT C DEXA: normal 2013 on estrogen, works out, vitamin D at goal, will wait 1-2 more years per patient request Colonoscopy: 02/2015 EGD: 2006  Echo 2008 EF 60%, normal aorta 2013 MRI lumbar 2009  DEE Dr. Sabra Heck, yearly Dentist  Dr. Sigmund Hazel q 6 months Patient Care  Team: Unk Pinto, MD as PCP - General (Internal Medicine) Viona Gilmore Sallye Lat, MD as Consulting Physician (Plastic Surgery) Gatha Mayer, MD as Consulting Physician (Gastroenterology) Megan Salon, MD as Consulting Physician (Gynecology) Rolm Bookbinder, MD as Consulting Physician (Dermatology)  Allergies Allergies  Allergen Reactions  . Sulfa Antibiotics Other (See Comments)    Unknown childhood reaction    SURGICAL HISTORY She  has a past surgical history that includes Umbilical hernia repair (1995); Esophagogastroduodenoscopy (12-12-2004); EUS (N/A, 03/10/2015); Wrist ganglion excision (Left, 10-04-2000); Colonoscopy (last one 02-11-2015); Breast enhancement surgery (Oct 2016); Blepharoptosis repair (Left, 2016); Dilation and curettage of uterus (1985); Tonsillectomy (as child); Dilatation & curettage/hysteroscopy with myosure (N/A, 03/31/2015); and Hemorrhoid banding (2017). FAMILY HISTORY Her family history includes Breast cancer in her mother; COPD in her father; Colon cancer in her maternal aunt, maternal grandfather, and paternal aunt; Colon polyps in her brother, father, and sister; Hypertension in her mother; Lung cancer in her father; Osteoporosis in her mother. SOCIAL HISTORY She  reports that she has never smoked. She has never used smokeless tobacco. She reports that she drinks about 5.4 - 9.6 oz of alcohol per week . She reports that she does not use drugs.  Medical History:  Review of Systems  Constitutional: Negative.   HENT: Negative.   Eyes: Negative.   Respiratory: Negative.   Cardiovascular: Positive for palpitations (intermittent, last seconds, no accompaniments, and nonexertional). Negative for chest pain, orthopnea, claudication, leg swelling and PND.  Gastrointestinal: Negative.   Genitourinary: Negative.   Musculoskeletal: Negative.   Skin: Negative for itching and rash.  Neurological: Negative.   Psychiatric/Behavioral: Negative.     Physical  Exam: Estimated body mass index is 18.56 kg/m as calculated from the following:   Height as of this encounter: 5' 4.5" (1.638 m).   Weight as of this encounter: 109 lb 12.8 oz (49.8 kg). BP 120/72   Pulse 71   Temp 97.3 F (36.3 C)   Resp 16   Ht 5' 4.5" (1.638 m)   Wt 109 lb 12.8 oz (49.8 kg)   LMP 01/01/2005   SpO2 98%   BMI 18.56 kg/m  General Appearance: Well nourished, in no apparent distress. Eyes: PERRLA, EOMs, conjunctiva no swelling or erythema, normal fundi and vessels. Sinuses: No Frontal/maxillary tenderness ENT/Mouth: Ext aud canals clear, normal light reflex with TMs without erythema, bulging.  Good dentition. No erythema, swelling, or exudate on post pharynx. Tonsils not swollen or erythematous. Hearing normal.  Neck: Supple, thyroid normal. No bruits Respiratory: Respiratory effort normal, BS equal bilaterally without rales, rhonchi, wheezing or stridor. Cardio: RRR without murmurs, rubs or gallops. Brisk peripheral pulses without edema.  Chest: symmetric, with normal excursions and percussion. Breasts: Dr. Sabra Heck Abdomen: Soft, +BS. Non tender, no guarding, rebound, hernias, masses, or organomegaly. .  Lymphatics: Non tender without lymphadenopathy.  Genitourinary: defer Dr. Sabra Heck Musculoskeletal: Full ROM all peripheral extremities,5/5 strength, and normal gait. Skin: Warm, dry without rashes, lesions, ecchymosis.  Neuro: Cranial nerves intact, reflexes equal bilaterally. Normal muscle tone, no cerebellar symptoms. Sensation intact.  Psych: Awake and oriented X 3, normal affect, Insight and Judgment appropriate.   EKG: normal, LAE, no changes   Vicie Mutters 3:24 PM

## 2015-08-25 NOTE — Patient Instructions (Signed)
[employer]- great site for unique hotels  [employer]  Stress and Stress Management Stress is a normal reaction to life events. It is what you feel when life demands more than you are used to or more than you can handle. Some stress can be useful. For example, the stress reaction can help you catch the last bus of the day, study for a test, or meet a deadline at work. But stress that occurs too often or for too long can cause problems. It can affect your emotional health and interfere with relationships and normal daily activities. Too much stress can weaken your immune system and increase your risk for physical illness. If you already have a medical problem, stress can make it worse. CAUSES  All sorts of life events may cause stress. An event that causes stress for one person may not be stressful for another person. Major life events commonly cause stress. These may be positive or negative. Examples include losing your job, moving into a new home, getting married, having a baby, or losing a loved one. Less obvious life events may also cause stress, especially if they occur day after day or in combination. Examples include working long hours, driving in traffic, caring for children, being in debt, or being in a difficult relationship. SIGNS AND SYMPTOMS Stress may cause emotional symptoms including, the following:  Anxiety. This is feeling worried, afraid, on edge, overwhelmed, or out of control.  Anger. This is feeling irritated or impatient.  Depression. This is feeling sad, down, helpless, or guilty.  Difficulty focusing, remembering, or making decisions. Stress may cause physical symptoms, including the following:   Aches and pains. These may affect your head, neck, back, stomach, or other areas of your body.  Tight muscles or clenched jaw.  Low energy or trouble sleeping. Stress may cause unhealthy behaviors, including the following:   Eating to feel better  (overeating) or skipping meals.  Sleeping too little, too much, or both.  Working too much or putting off tasks (procrastination).  Smoking, drinking alcohol, or using drugs to feel better. DIAGNOSIS  Stress is diagnosed through an assessment by your health care provider. Your health care provider will ask questions about your symptoms and any stressful life events.Your health care provider will also ask about your medical history and may order blood tests or other tests. Certain medical conditions and medicine can cause physical symptoms similar to stress. Mental illness can cause emotional symptoms and unhealthy behaviors similar to stress. Your health care provider may refer you to a mental health professional for further evaluation.  TREATMENT  Stress management is the recommended treatment for stress.The goals of stress management are reducing stressful life events and coping with stress in healthy ways.  Techniques for reducing stressful life events include the following:  Stress identification. Self-monitor for stress and identify what causes stress for you. These skills may help you to avoid some stressful events.  Time management. Set your priorities, keep a calendar of events, and learn to say "no." These tools can help you avoid making too many commitments. Techniques for coping with stress include the following:  Rethinking the problem. Try to think realistically about stressful events rather than ignoring them or overreacting. Try to find the positives in a stressful situation rather than focusing on the negatives.  Exercise. Physical exercise can release both physical and emotional tension. The key is to find a form of exercise you enjoy and do it regularly.  Relaxation techniques.  These relax the body and mind. Examples include yoga, meditation, tai chi, biofeedback, deep breathing, progressive muscle relaxation, listening to music, being out in nature, journaling, and other  hobbies. Again, the key is to find one or more that you enjoy and can do regularly.  Healthy lifestyle. Eat a balanced diet, get plenty of sleep, and do not smoke. Avoid using alcohol or drugs to relax.  Strong support network. Spend time with family, friends, or other people you enjoy being around.Express your feelings and talk things over with someone you trust. Counseling or talktherapy with a mental health professional may be helpful if you are having difficulty managing stress on your own. Medicine is typically not recommended for the treatment of stress.Talk to your health care provider if you think you need medicine for symptoms of stress. HOME CARE INSTRUCTIONS  Keep all follow-up visits as directed by your health care provider.  Take all medicines as directed by your health care provider. SEEK MEDICAL CARE IF:  Your symptoms get worse or you start having new symptoms.  You feel overwhelmed by your problems and can no longer manage them on your own. SEEK IMMEDIATE MEDICAL CARE IF:  You feel like hurting yourself or someone else.   This information is not intended to replace advice given to you by your health care provider. Make sure you discuss any questions you have with your health care provider.   Document Released: 06/13/2000 Document Revised: 01/08/2014 Document Reviewed: 08/12/2012 Elsevier Interactive Patient Education [employer].

## 2015-08-26 LAB — URINALYSIS, ROUTINE W REFLEX MICROSCOPIC
Bilirubin Urine: NEGATIVE
Glucose, UA: NEGATIVE
Hgb urine dipstick: NEGATIVE
Leukocytes, UA: NEGATIVE
Nitrite: NEGATIVE
Protein, ur: NEGATIVE
Specific Gravity, Urine: 1.014 (ref 1.001–1.035)
pH: 7 (ref 5.0–8.0)

## 2015-08-26 LAB — MICROALBUMIN / CREATININE URINE RATIO
Creatinine, Urine: 101 mg/dL (ref 20–320)
Microalb Creat Ratio: 8 mcg/mg creat (ref <30)
Microalb, Ur: 0.8 mg/dL

## 2015-08-26 LAB — VITAMIN D 25 HYDROXY (VIT D DEFICIENCY, FRACTURES): Vit D, 25-Hydroxy: 62 ng/mL (ref 30–100)

## 2015-08-26 LAB — TSH: TSH: 1.95 mIU/L

## 2015-08-30 ENCOUNTER — Other Ambulatory Visit: Payer: Self-pay | Admitting: Physician Assistant

## 2015-08-30 MED ORDER — ALPRAZOLAM 0.5 MG PO TABS: 0.5000 mg | ORAL_TABLET | Freq: Every evening | ORAL | 0 refills | Status: DC | PRN

## 2015-10-20 ENCOUNTER — Other Ambulatory Visit: Payer: Self-pay | Admitting: Obstetrics & Gynecology

## 2015-10-20 DIAGNOSIS — Z1231 Encounter for screening mammogram for malignant neoplasm of breast: Secondary | ICD-10-CM

## 2015-11-03 ENCOUNTER — Other Ambulatory Visit: Payer: Self-pay | Admitting: Obstetrics & Gynecology

## 2015-11-03 NOTE — Telephone Encounter (Signed)
Medication refill request: MIMVEY 1-0.5mg  Last AEX:  08/30/14 SM Next AEX: 12/08/15 Last MMG (if hormonal medication request): 10/07/14 BIRADS 1 negative; scheduled for 11/09/15 Refill authorized: 08/30/14 #30 w/13 refills; today please advise

## 2015-11-09 ENCOUNTER — Ambulatory Visit
Admission: RE | Admit: 2015-11-09 | Discharge: 2015-11-09 | Disposition: A | Discharge status: Home / Self Care | Payer: BLUE CROSS/BLUE SHIELD | Source: Ambulatory Visit | Attending: Obstetrics & Gynecology | Admitting: Obstetrics & Gynecology

## 2015-11-09 DIAGNOSIS — Z1231 Encounter for screening mammogram for malignant neoplasm of breast: Secondary | ICD-10-CM

## 2015-11-10 ENCOUNTER — Encounter: Payer: Self-pay | Admitting: Physician Assistant

## 2015-11-10 ENCOUNTER — Ambulatory Visit (INDEPENDENT_AMBULATORY_CARE_PROVIDER_SITE_OTHER): Payer: BLUE CROSS/BLUE SHIELD | Admitting: Physician Assistant

## 2015-11-10 VITALS — BP 116/70 | HR 74 | Temp 97.7°F | Resp 14 | Ht 64.5 in | Wt 112.8 lb

## 2015-11-10 DIAGNOSIS — J0100 Acute maxillary sinusitis, unspecified: Secondary | ICD-10-CM | POA: Diagnosis not present

## 2015-11-10 MED ORDER — AZITHROMYCIN 250 MG PO TABS: ORAL_TABLET | ORAL | 1 refills | Status: AC

## 2015-11-10 MED ORDER — PROMETHAZINE-DM 6.25-15 MG/5ML PO SYRP: 5.0000 mL | ORAL_SOLUTION | Freq: Four times a day (QID) | ORAL | 1 refills | Status: DC | PRN

## 2015-11-10 NOTE — Progress Notes (Signed)
Subjective:    Patient ID: Valerie Hubbard, female    DOB: May 12, 1955, 60 y.o.   MRN: [id number]  HPI 60 y.o. nonsmoking WF presents with cough x 5 weeks.  Had sinus infection x 2 weeks, was feeling better and then worse, has non productive cough, HA, sinus issues. Has been on mucinex, no allergy med.   Blood pressure 116/70, pulse 74, temperature 97.7 F (36.5 C), resp. rate 14, height 5' 4.5" (1.638 m), weight 112 lb 12.8 oz (51.2 kg), last menstrual period 01/01/2005, SpO2 99 %.  Medications Current Outpatient Prescriptions on File Prior to Visit  Medication Sig  . ALPRAZolam (XANAX) 0.5 MG tablet Take 1 tablet (0.5 mg total) by mouth at bedtime as needed.  Marland Kitchen aspirin EC 81 MG tablet Take 81 mg by mouth daily.  . fish oil-omega-3 fatty acids 1000 MG capsule Take 2 g by mouth daily.  . hyoscyamine (LEVSIN) 0.125 MG tablet Take 1 tablet (0.125 mg total) by mouth every 6 (six) hours as needed for cramping.  . loperamide (IMODIUM) 2 MG capsule Take by mouth as needed for diarrhea or loose stools. Reported on 02/02/2015  . MIMVEY 1-0.5 MG tablet TAKE 1 TABLET BY MOUTH DAILY.  . Multiple Vitamin (MULTIVITAMIN) capsule Take 1 capsule by mouth 2 (two) times daily. Reported on 02/02/2015  . Omeprazole-Sodium Bicarbonate (ZEGERID OTC PO) Take 1 capsule by mouth as needed. Reported on 02/02/2015  . OVER THE COUNTER MEDICATION    No current facility-administered medications on file prior to visit.     Problem list She has COLONIC POLYPS, ADENOMATOUS; Hyperlipidemia; GERD; IBS; FIBROCYSTIC BREAST DISEASE; Low bone mass; Menopausal symptom; Insomnia; and Internal bleeding hemorrhoids - Gr 2 on her problem list.  Review of Systems  Constitutional: Negative for chills and diaphoresis.  HENT: Positive for congestion, postnasal drip, sinus pressure and sneezing. Negative for ear pain and sore throat.   Respiratory: Positive for cough. Negative for chest tightness, shortness of breath and wheezing.    Cardiovascular: Negative.   Gastrointestinal: Negative.   Genitourinary: Negative.   Musculoskeletal: Negative for neck pain.  Neurological: Positive for headaches.       Objective:   Physical Exam  Constitutional: She appears well-developed and well-nourished.  HENT:  Head: Normocephalic and atraumatic.  Right Ear: External ear normal.  Nose: Right sinus exhibits maxillary sinus tenderness. Right sinus exhibits no frontal sinus tenderness. Left sinus exhibits maxillary sinus tenderness. Left sinus exhibits no frontal sinus tenderness.  Eyes: Conjunctivae and EOM are normal.  Neck: Normal range of motion. Neck supple.  Cardiovascular: Normal rate, regular rhythm, normal heart sounds and intact distal pulses.   Pulmonary/Chest: Effort normal and breath sounds normal. No respiratory distress. She has no wheezes.  Abdominal: Soft. Bowel sounds are normal.  Lymphadenopathy:    She has cervical adenopathy.  Skin: Skin is warm and dry.       Assessment & Plan:  1. Acute non-recurrent maxillary sinusitis Flonase, symbicort, get on allergy pill - azithromycin (ZITHROMAX) 250 MG tablet; Take 2 tablets (500 mg) on  Day 1,  followed by 1 tablet (250 mg) once daily on Days 2 through 5.  Dispense: 6 each; Refill: 1 - promethazine-dextromethorphan (PROMETHAZINE-DM) 6.25-15 MG/5ML syrup; Take 5 mLs by mouth 4 (four) times daily as needed for cough.  Dispense: 240 mL; Refill: 1   Future Appointments Date Time Provider [place]  12/08/2015 9:00 AM Megan Salon, MD [place] None  01/12/2016 8:30 AM Gatha Mayer, MD  [facility]  09/11/2016 2:00 PM Vicie Mutters, PA-C [facility] None

## 2015-11-10 NOTE — Patient Instructions (Signed)
flonase at night Dulera in the morning Allergy pill   Please pick one of the over the counter allergy medications below and take it once daily for allergies.  Claritin or loratadine cheapest but likely the weakest  Zyrtec or certizine at night because it can make you sleepy The strongest is allegra or fexafinadine  Cheapest at [facility], [facility], [facility]  - Try the Flonase or Nasonex. Remember to spray each nostril twice towards the outer part of your eye.  Do not sniff but instead pinch your nose and tilt your head back to help the medicine get into your sinuses.  The best time to do this is at bedtime.Stop if you get blurred vision or nose bleeds.   If not better take zpak   HOW TO TREAT VIRAL COUGH AND COLD SYMPTOMS:  -Symptoms usually last at least 1 week with the worst symptoms being around day 4.  - colds usually start with a sore throat and end with a cough, and the cough can take 2 weeks to get better.  -No antibiotics are needed for colds, flu, sore throats, cough, bronchitis UNLESS symptoms are longer than 7 days OR if you are getting better then get drastically worse.  -There are a lot of combination medications (Dayquil, Nyquil, Vicks 44, tyelnol cold and sinus, ETC). Please look at the ingredients on the back so that you are treating the correct symptoms and not doubling up on medications/ingredients.    Medicines you can use  Nasal congestion  - pseudoephedrine (Sudafed)- behind the counter, do not use if you have high blood pressure, medicine that have -D in them.  - phenylephrine (Sudafed PE) -Dextormethorphan + chlorpheniramine (Coridcidin HBP)- okay if you have high blood pressure -Oxymetazoline (Afrin) nasal spray- LIMIT to 3 days -Saline nasal spray -Neti pot (used distilled or bottled water)  Ear pain/congestion  -pseudoephedrine (sudafed) - Nasonex/flonase nasal spray  Fever  -Acetaminophen (Tyelnol) -Ibuprofen (Advil, motrin, aleve)  Sore  Throat  -Acetaminophen (Tyelnol) -Ibuprofen (Advil, motrin, aleve) -Drink a lot of water -Gargle with salt water - Rest your voice (don't talk) -Throat sprays -Cough drops  Body Aches  -Acetaminophen (Tyelnol) -Ibuprofen (Advil, motrin, aleve)  Headache  -Acetaminophen (Tyelnol) -Ibuprofen (Advil, motrin, aleve) - Exedrin, Exedrin Migraine  Allergy symptoms (cough, sneeze, runny nose, itchy eyes) -Claritin or loratadine cheapest but likely the weakest  -Zyrtec or certizine at night because it can make you sleepy -The strongest is allegra or fexafinadine  Cheapest at [facility], [employer], [facility]  Cough  -Dextromethorphan (Delsym)- medicine that has DM in it -Guafenesin (Mucinex/Robitussin) - cough drops - drink lots of water  Chest Congestion  -Guafenesin (Mucinex/Robitussin)  Red Itchy Eyes  - Naphcon-A  Upset Stomach  - Bland diet (nothing spicy, greasy, fried, and high acid foods like tomatoes, oranges, berries) -OKAY- cereal, bread, soup, crackers, rice -Eat smaller more frequent meals -reduce caffeine, no alcohol -Loperamide (Imodium-AD) if diarrhea -Prevacid for heart burn  General health when sick  -Hydration -wash your hands frequently -keep surfaces clean -change pillow cases and sheets often -Get fresh air but do not exercise strenuously -Vitamin D, double up on it - Vitamin C -Zinc

## 2015-12-08 ENCOUNTER — Encounter: Payer: Self-pay | Admitting: Obstetrics & Gynecology

## 2015-12-08 ENCOUNTER — Ambulatory Visit (INDEPENDENT_AMBULATORY_CARE_PROVIDER_SITE_OTHER): Payer: BLUE CROSS/BLUE SHIELD | Admitting: Obstetrics & Gynecology

## 2015-12-08 VITALS — BP 110/74 | HR 64 | Resp 14 | Ht 64.0 in | Wt 114.0 lb

## 2015-12-08 DIAGNOSIS — Z01419 Encounter for gynecological examination (general) (routine) without abnormal findings: Secondary | ICD-10-CM | POA: Diagnosis not present

## 2015-12-08 MED ORDER — ESTRADIOL-NORETHINDRONE ACET 1-0.5 MG PO TABS: 1.0000 | ORAL_TABLET | Freq: Every day | ORAL | 13 refills | Status: DC

## 2015-12-08 NOTE — Progress Notes (Signed)
60 y.o. [id number] DivorcedCaucasianF here for annual exam.  Doing well.  Traveling a lot with work.  Denies vaginal bleeding.  Dating same person.    Patient's last menstrual period was 01/01/2005.          Sexually active: Yes.    The current method of family planning is post menopausal status.    Exercising: No.  The patient does not participate in regular exercise at present. Smoker: No  Health Maintenance: Pap:  08/30/14 Neg. HR HPV:neg  History of abnormal Pap:  yes MMG:  11/09/15 BIRADS1:neg  Colonoscopy:  02/11/15 Polyps- f/u 5 years  BMD:   2/29/13 Osteopenia  TDaP:  07/2013  Pneumonia vaccine(s):  2005  Zostavax:   No Hep C testing: 7/16 Screening Labs: PCP   reports that she has never smoked. She has never used smokeless tobacco. She reports that she drinks about 5.4 - 9.6 oz of alcohol per week . She reports that she does not use drugs.  Past Medical History:  Diagnosis Date  . Endometrial polyp   . GERD (gastroesophageal reflux disease)   . History of adenomatous polyp of colon    multiple  . History of esophagitis    and duodenitis   12/ 2006  . Hyperlipidemia   . IBS (irritable bowel syndrome)   . Internal bleeding hemorrhoids - Gr 2 05/03/2015   Gr 2 RP LL RA - RP banded 05/03/2015 06/14/2015 RA and LL banded    . osteopenia   . PMB (postmenopausal bleeding)   . 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 Wears glasses     Past Surgical History:  Procedure Laterality Date  . BELPHAROPTOSIS REPAIR Left 2016   upper and lower   . BREAST ENHANCEMENT SURGERY  Oct 2016  . COLONOSCOPY  last one 02-11-2015  . DILATATION & CURETTAGE/HYSTEROSCOPY WITH MYOSURE N/A 03/31/2015   Procedure: DILATATION & CURETTAGE/HYSTEROSCOPY WITH MYOSURE;  Surgeon: Megan Salon, MD;  Location: [facility];  Service: Gynecology;  Laterality: N/A;  endometrial polyp  . DILATION AND CURETTAGE OF UTERUS  1985   w/ suction  . ESOPHAGOGASTRODUODENOSCOPY  12-12-2004  . EUS  N/A 03/10/2015   Procedure: LOWER ENDOSCOPIC ULTRASOUND (EUS);  Surgeon: [name], MD;  Location: [facility];  Service: Endoscopy;  Laterality: N/A;  . HEMORRHOID BANDING  2017  . TONSILLECTOMY  as child  . [place]   with Bilateral Tubal Ligation  . WRIST GANGLION EXCISION Left 10-04-2000    Current Outpatient Prescriptions  Medication Sig Dispense Refill  . ALPRAZolam (XANAX) 0.5 MG tablet Take 1 tablet (0.5 mg total) by mouth at bedtime as needed. 30 tablet 0  . aspirin EC 81 MG tablet Take 81 mg by mouth daily.    . Calcium Citrate (CALCITRATE PO) Take by mouth.    . fish oil-omega-3 fatty acids 1000 MG capsule Take 2 g by mouth daily.    . hyoscyamine (LEVSIN) 0.125 MG tablet Take 1 tablet (0.125 mg total) by mouth every 6 (six) hours as needed for cramping. 60 tablet 2  . loperamide (IMODIUM) 2 MG capsule Take by mouth as needed for diarrhea or loose stools. Reported on 02/02/2015    . MIMVEY 1-0.5 MG tablet TAKE 1 TABLET BY MOUTH DAILY. (Patient taking differently: Take 0.5 tablets by mouth daily. ) 28 tablet 2  . Multiple Vitamin (MULTIVITAMIN) capsule Take 1 capsule by mouth 2 (two) times daily. Reported on 02/02/2015    .  Omeprazole-Sodium Bicarbonate (ZEGERID OTC PO) Take 1 capsule by mouth as needed. Reported on 02/02/2015     No current facility-administered medications for this visit.     Family History  Problem Relation Age of Onset  . Breast cancer Mother   . Hypertension Mother   . Osteoporosis Mother   . COPD Father   . Lung cancer Father   . Colon polyps Father   . Colon cancer Maternal Grandfather   . Colon cancer Maternal Aunt   . Colon cancer Paternal Aunt   . Colon polyps Sister   . Colon polyps Brother     ROS:  Pertinent items are noted in HPI.  Otherwise, a comprehensive ROS was negative.  Exam:   BP 110/74 (BP Location: Right Arm, Patient Position: Sitting, Cuff Size: Normal)   Pulse 64   Resp 14   Ht 5\' 4"  (1.626 m)   Wt  114 lb (51.7 kg)   LMP 01/01/2005   BMI 19.57 kg/m   Weight change: +6#  Height: 5\' 4"  (162.6 cm)  Ht Readings from Last 3 Encounters:  12/08/15 5\' 4"  (1.626 m)  11/10/15 5' 4.5" (1.638 m)  08/25/15 5' 4.5" (1.638 m)    General appearance: alert, cooperative and appears stated age Head: Normocephalic, without obvious abnormality, atraumatic Neck: no adenopathy, supple, symmetrical, trachea midline and thyroid normal to inspection and palpation Lungs: clear to auscultation bilaterally Breasts: normal appearance, no masses or tenderness, bilateral implants present Heart: regular rate and rhythm Abdomen: soft, non-tender; bowel sounds normal; no masses,  no organomegaly Extremities: extremities normal, atraumatic, no cyanosis or edema Skin: Skin color, texture, turgor normal. No rashes or lesions Lymph nodes: Cervical, supraclavicular, and axillary nodes normal. No abnormal inguinal nodes palpated Neurologic: Grossly normal   Pelvic: External genitalia:  no lesions              Urethra:  normal appearing urethra with no masses, tenderness or lesions              Bartholins and Skenes: normal                 Vagina: normal appearing vagina with normal color and discharge, no lesions              Cervix: no lesions              Pap taken: No. Bimanual Exam:  Uterus:  normal size, contour, position, consistency, mobility, non-tender              Adnexa: normal adnexa and no mass, fullness, tenderness               Rectovaginal: Confirms               Anus:  normal sphincter tone, no lesions  Chaperone was present for exam.  A:  Well Woman with normal exam PMP, on HRT.  (Had tried to wean off of this but has significant hot flashes with being off.) H/o adenomatous colon polyps  IBS  GERD Carcinoid rectal mass removed 3/17 with Dr. Ardis Hughs.  Pathology states this was "cured".  No additional follow up needed.  P: Mammogram yearly. 3D discussed due to dense breasts. Pap  and neg HR HPV 2016.  No pap smear obtained today.   Rx for activella 1/0.5 mg, taking 1/2 daily.  Rx to pharmacy.  We again discussed risks including DVT/PE (as she travels and takes long distance flights), increased risks of breast cancer specifically in  light of having a mother with a post menopausal breast cancer, DUB. No rx for xanax this year.  Pt requests this about every third year. Labs with PCP Return annually or prn

## 2016-01-12 ENCOUNTER — Ambulatory Visit (INDEPENDENT_AMBULATORY_CARE_PROVIDER_SITE_OTHER): Payer: BLUE CROSS/BLUE SHIELD | Admitting: Internal Medicine

## 2016-01-12 ENCOUNTER — Encounter: Payer: Self-pay | Admitting: Internal Medicine

## 2016-01-12 ENCOUNTER — Ambulatory Visit: Payer: BLUE CROSS/BLUE SHIELD | Admitting: Internal Medicine

## 2016-01-12 VITALS — BP 104/68 | HR 88 | Ht 64.0 in | Wt 114.1 lb

## 2016-01-12 DIAGNOSIS — K648 Other hemorrhoids: Secondary | ICD-10-CM

## 2016-01-12 DIAGNOSIS — K580 Irritable bowel syndrome with diarrhea: Secondary | ICD-10-CM | POA: Diagnosis not present

## 2016-01-12 MED ORDER — ONDANSETRON HCL 4 MG PO TABS: 4.0000 mg | ORAL_TABLET | Freq: Three times a day (TID) | ORAL | 1 refills | Status: DC | PRN

## 2016-01-12 NOTE — Assessment & Plan Note (Signed)
Loperamide prn seems to work

## 2016-01-12 NOTE — Patient Instructions (Signed)
HEMORRHOID BANDING PROCEDURE    FOLLOW-UP CARE   1. The procedure you have had should have been relatively painless since the banding of the area involved does not have nerve endings and there is no pain sensation.  The rubber band cuts off the blood supply to the hemorrhoid and the band may fall off as soon as 48 hours after the banding (the band may occasionally be seen in the toilet bowl following a bowel movement). You may notice a temporary feeling of fullness in the rectum which should respond adequately to plain Tylenol or Motrin.  2. Following the banding, avoid strenuous exercise that evening and resume full activity the next day.  A sitz bath (soaking in a warm tub) or bidet is soothing, and can be useful for cleansing the area after bowel movements.     3. To avoid constipation, take two tablespoons of natural wheat bran, natural oat bran, flax, Benefiber or any over the counter fiber supplement and increase your water intake to 7-8 glasses daily.    4. Unless you have been prescribed anorectal medication, do not put anything inside your rectum for two weeks: No suppositories, enemas, fingers, etc.  5. Occasionally, you may have more bleeding than usual after the banding procedure.  This is often from the untreated hemorrhoids rather than the treated one.  Don't be concerned if there is a tablespoon or so of blood.  If there is more blood than this, lie flat with your bottom higher than your head and apply an ice pack to the area. If the bleeding does not stop within a half an hour or if you feel faint, call our office at [phone] or go to the emergency room.  6. Problems are not common; however, if there is a substantial amount of bleeding, severe pain, chills, fever or difficulty passing urine (very rare) or other problems, you should call us at [phone] or report to the nearest emergency room.  7. Do not stay seated continuously for more than 2-3 hours for a day or two  after the procedure.  Tighten your buttock muscles 10-15 times every two hours and take 10-15 deep breaths every 1-2 hours.  Do not spend more than a few minutes on the toilet if you cannot empty your bowel; instead re-visit the toilet at a later time.    Today we are giving you samples of VSL #3 to try, take one day.    I appreciate the opportunity to care for you. Silvano Rusk, MD, [facility]

## 2016-01-12 NOTE — Progress Notes (Signed)
   Valerie Hubbard 61 y.o. 1955-02-20 [id number]  Assessment & Plan:  Internal bleeding hemorrhoids - Gr 2 Re-banding today RA and RP RTC prn  IBS Loperamide prn seems to work    Subjective:   Chief Complaint: rectal bleeding  HPI Having some slight intermittent rectal bleeding still.Much better overall since banding last year. One band seemed to fall off early. Some days has multiple soft stools - usu in AM. + [place] Never gave VSL # 3 a good chance in past  Medications, allergies, past medical history, past surgical history, family history and social history are reviewed and updated in the EMR.  Review of Systems As above  Objective:   Physical Exam BP 104/68 (BP Location: Left Arm, Patient Position: Sitting, Cuff Size: Normal)   Pulse 88   Ht 5\' 4"  (1.626 m)   Wt 114 lb 2 oz (51.8 kg)   LMP 01/01/2005   BMI 19.59 kg/m  NAD  Female staff present: Rectal - small fleshy tags NL tone, no mass and non-tender  Anoscopy - Gr 1 inflamed RA and RP internal hemorrhoids  PROCEDURE NOTE: The patient presents with symptomatic grade 2  hemorrhoids, requesting rubber band ligation of his/her hemorrhoidal disease.  All risks, benefits and alternative forms of therapy were described and informed consent was obtained.   The anorectum was pre-medicated with 0.125% NTG and 5% lidocaine The decision was made to band the RA and RP internal hemorrhoid, and the [place] was used to perform band ligation without complication.  Digital anorectal examination was then performed to assure proper positioning of the band, and to adjust the banded tissue as required.  The patient was discharged home without pain or other issues.  Dietary and behavioral recommendations were given and along with follow-up instructions.

## 2016-01-12 NOTE — Assessment & Plan Note (Signed)
Re-banding today RA and RP RTC prn

## 2016-01-30 ENCOUNTER — Telehealth: Payer: Self-pay | Admitting: Internal Medicine

## 2016-01-30 NOTE — Telephone Encounter (Signed)
Spoke with Valerie Hubbard and although she has had some trouble with the taste of the VSL #3 DS she has taken it and only has 3 packets left.  She feels it has helped her to feel better.  She would like [facility] rx [facility] and wants to know how long you want her to continue the VSL #3 DS.  Will route to Dr Carlean Purl to advise.

## 2016-01-31 NOTE — Telephone Encounter (Signed)
Just to  elucidate is it ok to take indefinitely Sir?

## 2016-01-31 NOTE — Telephone Encounter (Signed)
Informed Valerie Hubbard and she is going to check with her pharmacy to see how expensive the over the counter VSL #3 450 strength is. She said she may call back and get [place] to send in a rx for the 900 billion type even thou it is not in capsule form.

## 2016-01-31 NOTE — Telephone Encounter (Signed)
She can move to regular strength VSL #3 which is OTC Can take daily and might be in a capsule.

## 2016-01-31 NOTE — Telephone Encounter (Signed)
It is ok

## 2016-02-02 ENCOUNTER — Telehealth: Payer: Self-pay | Admitting: Internal Medicine

## 2016-02-02 NOTE — Telephone Encounter (Signed)
Spoke with Stanton Kidney and the VSL #3 450 billion is going to be expensive.  She doesn't want me to send her in the 900 billion at this time.  She's going to investigate some other probiotics at this time.

## 2016-06-28 ENCOUNTER — Telehealth: Payer: Self-pay | Admitting: Internal Medicine

## 2016-06-28 NOTE — Telephone Encounter (Signed)
Valerie Hubbard is on the VSL #3 112 strength at 2 capsules every AM.  She is not seeing much difference and wants to know if she moves up to the 450 strength how many a day should she take and how long should she give it a try.  Before we hung up she said she is going to call her drug store and check prices tonight. We will touch base again in the AM before I route this to Dr Carlean Purl.

## 2016-06-29 NOTE — Telephone Encounter (Signed)
So if the probiotics have not changed that pattern I would not take them. Imodium as needed is ok

## 2016-06-29 NOTE — Telephone Encounter (Signed)
The 450 and 900 billion cost too much.  Please advise how many to take of the 112.5 and for how long Sir,thank you.

## 2016-06-29 NOTE — Telephone Encounter (Signed)
Valerie Hubbard said that she just has random spells of the loose but formed stools.  Onset " a long time" . Last week was like 4-5 loose bowel movements in the AM.  Taking Imodium, seems to help.  She didn't express an interest in taking the xifaxan again. Please advise.

## 2016-06-29 NOTE — Telephone Encounter (Signed)
I need some more history regarding symptoms. How much diarrhea, how often? I am assuming she is saying that's what the problem is since the probiotic doesn't seem to be making much of a difference  She took [place] in 2016 and had some improvement so it could be reasonable to try that again if she wants.  Lead me know

## 2016-06-29 NOTE — Telephone Encounter (Signed)
Annasophia informed and will try this and she will call us back if more issues arise.

## 2016-07-20 ENCOUNTER — Other Ambulatory Visit: Payer: Self-pay | Admitting: Internal Medicine

## 2016-07-20 ENCOUNTER — Other Ambulatory Visit: Payer: Self-pay | Admitting: Physician Assistant

## 2016-07-20 NOTE — Telephone Encounter (Signed)
Please call Alpraz  

## 2016-07-23 NOTE — Telephone Encounter (Signed)
Xanax called into pharmacy on 23rd July 2018 at 8:23am by DD

## 2016-09-11 ENCOUNTER — Encounter: Payer: Self-pay | Admitting: Physician Assistant

## 2016-11-08 ENCOUNTER — Ambulatory Visit (INDEPENDENT_AMBULATORY_CARE_PROVIDER_SITE_OTHER): Payer: BLUE CROSS/BLUE SHIELD | Admitting: Physician Assistant

## 2016-11-08 ENCOUNTER — Encounter: Payer: Self-pay | Admitting: Physician Assistant

## 2016-11-08 VITALS — BP 116/74 | HR 68 | Temp 97.9°F | Resp 14 | Ht 64.5 in | Wt 110.2 lb

## 2016-11-08 DIAGNOSIS — Z681 Body mass index (BMI) 19 or less, adult: Secondary | ICD-10-CM

## 2016-11-08 DIAGNOSIS — Z0000 Encounter for general adult medical examination without abnormal findings: Secondary | ICD-10-CM

## 2016-11-08 DIAGNOSIS — K581 Irritable bowel syndrome with constipation: Secondary | ICD-10-CM

## 2016-11-08 DIAGNOSIS — Z1389 Encounter for screening for other disorder: Secondary | ICD-10-CM

## 2016-11-08 DIAGNOSIS — Z79899 Other long term (current) drug therapy: Secondary | ICD-10-CM

## 2016-11-08 DIAGNOSIS — I10 Essential (primary) hypertension: Secondary | ICD-10-CM | POA: Diagnosis not present

## 2016-11-08 DIAGNOSIS — E559 Vitamin D deficiency, unspecified: Secondary | ICD-10-CM | POA: Diagnosis not present

## 2016-11-08 DIAGNOSIS — D126 Benign neoplasm of colon, unspecified: Secondary | ICD-10-CM

## 2016-11-08 DIAGNOSIS — M8580 Other specified disorders of bone density and structure, unspecified site: Secondary | ICD-10-CM

## 2016-11-08 DIAGNOSIS — K2100 Gastro-esophageal reflux disease with esophagitis, without bleeding: Secondary | ICD-10-CM

## 2016-11-08 DIAGNOSIS — Z130 Encounter for screening for diseases of the blood and blood-forming organs and certain disorders involving the immune mechanism: Secondary | ICD-10-CM | POA: Diagnosis not present

## 2016-11-08 DIAGNOSIS — G4700 Insomnia, unspecified: Secondary | ICD-10-CM

## 2016-11-08 DIAGNOSIS — E785 Hyperlipidemia, unspecified: Secondary | ICD-10-CM

## 2016-11-08 DIAGNOSIS — N6019 Diffuse cystic mastopathy of unspecified breast: Secondary | ICD-10-CM

## 2016-11-08 DIAGNOSIS — N951 Menopausal and female climacteric states: Secondary | ICD-10-CM

## 2016-11-08 NOTE — Patient Instructions (Signed)
[facility]  7 a.m.-6:30 p.m., Monday 7 a.m.-5 p.m., Tuesday-Friday Schedule an appointment by calling [phone].  Encourage you to get the 3D Mammogram  The 3D Mammogram is much more specific and sensitive to pick up breast cancer. For women with fibrocystic breast or lumpy breast it can be hard to determine if it is cancer or not but the 3D mammogram is able to tell this difference which cuts back on unneeded additional tests or scary call backs.   - over 40% increase in detection of breast cancer - over 40% reduction in false positives.  - fewer call backs - reduced anxiety - improved outcomes - [place] test with no obligation # [phone] MUST BE A MEMBER Call for store hours

## 2016-11-08 NOTE — Progress Notes (Signed)
Complete Physical  Assessment and Plan: Low bone mass -     VITAMIN D 25 Hydroxy (Vit-D Deficiency, Fractures) - will get DEXA 65  Menopausal symptom Controlled  Hyperlipidemia -     CBC with Differential/Platelet -     BASIC METABOLIC PANEL WITH GFR -     Hepatic function panel -     TSH -     Lipid panel -     EKG 12-Lead  Insomnia Controlled, sleep hygiene discussed  Benign neoplasm of colon, unspecified part of colon UTD  Gastroesophageal reflux disease with esophagitis Continue PPI  Irritable bowel syndrome with diarrhea -     hyoscyamine (LEVSIN) 0.125 MG tablet; Take 1 tablet (0.125 mg total) by mouth every 6 (six) hours as needed for cramping.  Routine general medical examination at a health care facility  Medication management -     Magnesium  Screening for hematuria or proteinuria -     Urinalysis, Routine w reflex microscopic (not at [facility]) -     Microalbumin / creatinine urine ratio   Discussed med's effects and SE's. Screening labs and tests as requested with regular follow-up as recommended.  HPI 61 y.o. female  presents for a complete physical.  Her blood pressure has been controlled at home, today their BP is BP: 116/74 She does workout, walks. She denies chest pain, shortness of breath, dizziness.  She is on cholesterol medication and denies myalgias. Her cholesterol is at goal. The cholesterol last visit was:   Lab Results  Component Value Date   CHOL 250 (H) 08/25/2015   HDL 119 08/25/2015   LDLCALC 121 08/25/2015   TRIG 52 08/25/2015   CHOLHDL 2.1 08/25/2015   Last A1C in the office was: 5.6 She is on minivelle and bASA, will get MGM in Oct Patient is on Vitamin D supplement.   Lab Results  Component Value Date   VD25OH 62 08/25/2015   Flutters were better with zegrid.  Has some anxiety, rarely takes xanax, has IBS, follows with Dr. Carlean Purl She states her job is stressful, she is traveling 2-3 weeks out of the month.  BMI is Body  mass index is 18.62 kg/m., she is working on diet and exercise. Wt Readings from Last 3 Encounters:  11/08/16 110 lb 3.2 oz (50 kg)  01/12/16 114 lb 2 oz (51.8 kg)  12/08/15 114 lb (51.7 kg)    Current Medications:  Current Outpatient Medications on File Prior to Visit  Medication Sig Dispense Refill  . ALPRAZolam (XANAX) 0.5 MG tablet TAKE 1 TABLET BY MOUTH AT BEDTIME AS NEEDED 30 tablet 2  . aspirin EC 81 MG tablet Take 81 mg by mouth daily.    . Calcium Citrate (CALCITRATE PO) Take by mouth.    . estradiol-norethindrone (MIMVEY) 1-0.5 MG tablet Take 1 tablet by mouth daily. 28 tablet 13  . fish oil-omega-3 fatty acids 1000 MG capsule Take 2 g by mouth daily.    . hyoscyamine (LEVSIN) 0.125 MG tablet Take 1 tablet (0.125 mg total) by mouth every 6 (six) hours as needed for cramping. 60 tablet 2  . loperamide (IMODIUM) 2 MG capsule Take by mouth as needed for diarrhea or loose stools. Reported on 02/02/2015    . Multiple Vitamin (MULTIVITAMIN) capsule Take 1 capsule by mouth 2 (two) times daily. Reported on 02/02/2015    . Omeprazole-Sodium Bicarbonate (ZEGERID OTC PO) Take 1 capsule by mouth as needed. Reported on 02/02/2015     No current facility-administered medications  on file prior to visit.    Health Maintenance:   Immunization History  Administered Date(s) Administered  . Pneumococcal Polysaccharide-23 01/02/2003  . Td 04/15/2003  . Tdap 07/14/2013   TDAP: 2015 Pneumovax: 1997 Prevnar 13 due at 65 Flu vaccine: N/A Zostavax: N/A  Pap: 2015 at OB/GYN 3D MGM: 11/2015 CAT C DEXA: normal 2013 on estrogen, works out, vitamin D at goal, will wait 1-2 more years per patient request Colonoscopy: 02/2015 EGD: 2006  Echo 2008 EF 60%, normal aorta 2013 MRI lumbar 2009 CXR 2016  DEE Dr. Sabra Heck, yearly Dentist  Dr. Sigmund Hazel q 6 months Patient Care Team: Unk Pinto, MD as PCP - General (Internal Medicine) Renee Pain, MD as Consulting Physician (Plastic Surgery) Gatha Mayer, MD as Consulting Physician (Gastroenterology) Megan Salon, MD as Consulting Physician (Gynecology) Rolm Bookbinder, MD as Consulting Physician (Dermatology)  Allergies Allergies  Allergen Reactions  . Sulfa Antibiotics Other (See Comments)    Unknown childhood reaction    SURGICAL HISTORY She  has a past surgical history that includes Umbilical hernia repair (1995); Esophagogastroduodenoscopy (12-12-2004); Wrist ganglion excision (Left, 10-04-2000); Colonoscopy (last one 02-11-2015); Breast enhancement surgery (Oct 2016); Blepharoptosis repair (Left, 2016); Dilation and curettage of uterus (1985); Tonsillectomy (as child); Hemorrhoid banding (2017); DILATATION & CURETTAGE/HYSTEROSCOPY WITH MYOSURE (N/A, 03/31/2015); and LOWER ENDOSCOPIC ULTRASOUND (EUS) (N/A, 03/10/2015). FAMILY HISTORY Her family history includes Breast cancer in her mother; COPD in her father; Colon cancer in her maternal aunt, maternal grandfather, and paternal aunt; Colon polyps in her brother, father, and sister; Hypertension in her mother; Lung cancer in her father; Osteoporosis in her mother. SOCIAL HISTORY She  reports that  has never smoked. she has never used smokeless tobacco. She reports that she drinks about 5.4 - 9.6 oz of alcohol per week. She reports that she does not use drugs.  Medical History:  Review of Systems  Constitutional: Negative.   HENT: Negative.   Eyes: Negative.   Respiratory: Negative.   Cardiovascular: Negative for chest pain, palpitations, orthopnea, claudication, leg swelling and PND.  Gastrointestinal: Negative.   Genitourinary: Negative.   Musculoskeletal: Negative.   Skin: Negative for itching and rash.  Neurological: Negative.   Psychiatric/Behavioral: Negative.     Physical Exam: Estimated body mass index is 18.62 kg/m as calculated from the following:   Height as of this encounter: 5' 4.5" (1.638 m).   Weight as of this encounter: 110 lb 3.2 oz (50 kg). BP 116/74    Pulse 68   Temp 97.9 F (36.6 C)   Resp 14   Ht 5' 4.5" (1.638 m)   Wt 110 lb 3.2 oz (50 kg)   LMP 01/01/2005   SpO2 96%   BMI 18.62 kg/m  General Appearance: Well nourished, in no apparent distress. Eyes: PERRLA, EOMs, conjunctiva no swelling or erythema, normal fundi and vessels. Sinuses: No Frontal/maxillary tenderness ENT/Mouth: Ext aud canals clear, normal light reflex with TMs without erythema, bulging.  Good dentition. No erythema, swelling, or exudate on post pharynx. Tonsils not swollen or erythematous. Hearing normal.  Neck: Supple, thyroid normal. No bruits Respiratory: Respiratory effort normal, BS equal bilaterally without rales, rhonchi, wheezing or stridor. Cardio: RRR without murmurs, rubs or gallops. Brisk peripheral pulses without edema.  Chest: symmetric, with normal excursions and percussion. Breasts: Dr. Sabra Heck Abdomen: Soft, +BS. Non tender, no guarding, rebound, hernias, masses, or organomegaly. .  Lymphatics: Non tender without lymphadenopathy.  Genitourinary: defer Dr. Sabra Heck Musculoskeletal: Full ROM all peripheral extremities,5/5 strength, and  normal gait. Skin: Warm, dry without rashes, lesions, ecchymosis.  Neuro: Cranial nerves intact, reflexes equal bilaterally. Normal muscle tone, no cerebellar symptoms. Sensation intact.  Psych: Awake and oriented X 3, normal affect, Insight and Judgment appropriate.   EKG: defer   Vicie Mutters 9:12 AM

## 2016-11-09 LAB — URINALYSIS, ROUTINE W REFLEX MICROSCOPIC
Bilirubin Urine: NEGATIVE
Glucose, UA: NEGATIVE
Hgb urine dipstick: NEGATIVE
Ketones, ur: NEGATIVE
Leukocytes, UA: NEGATIVE
Nitrite: NEGATIVE
Protein, ur: NEGATIVE
Specific Gravity, Urine: 1.009 (ref 1.001–1.03)
pH: 7.5 (ref 5.0–8.0)

## 2016-11-09 LAB — CBC WITH DIFFERENTIAL/PLATELET
Basophils Absolute: 140 cells/uL (ref 0–200)
Basophils Relative: 2.3 %
Eosinophils Absolute: 140 cells/uL (ref 15–500)
Eosinophils Relative: 2.3 %
HCT: 42.8 % (ref 35.0–45.0)
Hemoglobin: 14.6 g/dL (ref 11.7–15.5)
Lymphs Abs: 1873 cells/uL (ref 850–3900)
MCH: 32.3 pg (ref 27.0–33.0)
MCHC: 34.1 g/dL (ref 32.0–36.0)
MCV: 94.7 fL (ref 80.0–100.0)
MPV: 9.2 fL (ref 7.5–12.5)
Monocytes Relative: 8.8 %
Neutro Abs: 3410 cells/uL (ref 1500–7800)
Neutrophils Relative %: 55.9 %
Platelets: 305 10*3/uL (ref 140–400)
RBC: 4.52 10*6/uL (ref 3.80–5.10)
RDW: 12.4 % (ref 11.0–15.0)
Total Lymphocyte: 30.7 %
WBC mixed population: 537 cells/uL (ref 200–950)
WBC: 6.1 10*3/uL (ref 3.8–10.8)

## 2016-11-09 LAB — BASIC METABOLIC PANEL WITH GFR
BUN: 9 mg/dL (ref 7–25)
CO2: 30 mmol/L (ref 20–32)
Calcium: 9.6 mg/dL (ref 8.6–10.4)
Chloride: 104 mmol/L (ref 98–110)
Creat: 0.73 mg/dL (ref 0.50–0.99)
GFR, Est African American: 103 mL/min/{1.73_m2} (ref >=60)
GFR, Est Non African American: 89 mL/min/{1.73_m2} (ref >=60)
Glucose, Bld: 88 mg/dL (ref 65–99)
Potassium: 4.2 mmol/L (ref 3.5–5.3)
Sodium: 140 mmol/L (ref 135–146)

## 2016-11-09 LAB — LIPID PANEL
Cholesterol: 235 mg/dL — ABNORMAL HIGH (ref <200)
HDL: 104 mg/dL (ref >50)
LDL Cholesterol (Calc): 114 mg/dL (calc) — ABNORMAL HIGH
Non-HDL Cholesterol (Calc): 131 mg/dL (calc) — ABNORMAL HIGH (ref <130)
Total CHOL/HDL Ratio: 2.3 (calc) (ref <5.0)
Triglycerides: 73 mg/dL (ref <150)

## 2016-11-09 LAB — TSH: TSH: 1.79 mIU/L (ref 0.40–4.50)

## 2016-11-09 LAB — IRON, TOTAL/TOTAL IRON BINDING CAP
%SAT: 29 % (calc) (ref 11–50)
Iron: 107 ug/dL (ref 45–160)
TIBC: 370 mcg/dL (calc) (ref 250–450)

## 2016-11-09 LAB — HEPATIC FUNCTION PANEL
AG Ratio: 2 (calc) (ref 1.0–2.5)
ALT: 14 U/L (ref 6–29)
AST: 17 U/L (ref 10–35)
Albumin: 4.7 g/dL (ref 3.6–5.1)
Alkaline phosphatase (APISO): 34 U/L (ref 33–130)
Bilirubin, Direct: 0.1 mg/dL (ref 0.0–0.2)
Globulin: 2.4 g/dL (calc) (ref 1.9–3.7)
Indirect Bilirubin: 0.6 mg/dL (calc) (ref 0.2–1.2)
Total Bilirubin: 0.7 mg/dL (ref 0.2–1.2)
Total Protein: 7.1 g/dL (ref 6.1–8.1)

## 2016-11-09 LAB — MICROALBUMIN / CREATININE URINE RATIO
Creatinine, Urine: 33 mg/dL (ref 20–275)
Microalb Creat Ratio: 6 mcg/mg creat (ref <30)
Microalb, Ur: 0.2 mg/dL

## 2016-11-09 LAB — MAGNESIUM: Magnesium: 2.1 mg/dL (ref 1.5–2.5)

## 2016-11-09 LAB — VITAMIN D 25 HYDROXY (VIT D DEFICIENCY, FRACTURES): Vit D, 25-Hydroxy: 60 ng/mL (ref 30–100)

## 2016-11-09 NOTE — Progress Notes (Signed)
LVM for pt to return office call for LAB results.

## 2016-11-09 NOTE — Progress Notes (Signed)
Pt aware of lab results & voiced understanding of those result & results were mailed out to pt per pt's request.

## 2016-12-31 ENCOUNTER — Other Ambulatory Visit: Payer: Self-pay | Admitting: Obstetrics & Gynecology

## 2016-12-31 NOTE — Telephone Encounter (Signed)
Medication refill request: MIMVEY 1-0.5 MG TABLET Last AEX:  12-08-15  Next AEX: 03-19-17  Last MMG (if hormonal medication request): 11-09-15 WNL  Refill authorized: please advise

## 2017-01-15 ENCOUNTER — Other Ambulatory Visit: Payer: Self-pay | Admitting: Obstetrics & Gynecology

## 2017-01-15 DIAGNOSIS — Z1231 Encounter for screening mammogram for malignant neoplasm of breast: Secondary | ICD-10-CM

## 2017-02-07 ENCOUNTER — Ambulatory Visit
Admission: RE | Admit: 2017-02-07 | Discharge: 2017-02-07 | Disposition: A | Discharge status: Home / Self Care | Payer: BLUE CROSS/BLUE SHIELD | Source: Ambulatory Visit | Attending: Obstetrics & Gynecology | Admitting: Obstetrics & Gynecology

## 2017-02-07 DIAGNOSIS — Z1231 Encounter for screening mammogram for malignant neoplasm of breast: Secondary | ICD-10-CM

## 2017-02-12 ENCOUNTER — Other Ambulatory Visit: Payer: Self-pay

## 2017-02-12 ENCOUNTER — Encounter: Payer: Self-pay | Admitting: Obstetrics & Gynecology

## 2017-02-12 ENCOUNTER — Other Ambulatory Visit (HOSPITAL_COMMUNITY)
Admission: RE | Admit: 2017-02-12 | Discharge: 2017-02-12 | Disposition: A | Discharge status: Home / Self Care | Payer: BLUE CROSS/BLUE SHIELD | Source: Ambulatory Visit | Attending: Obstetrics & Gynecology | Admitting: Obstetrics & Gynecology

## 2017-02-12 ENCOUNTER — Ambulatory Visit (INDEPENDENT_AMBULATORY_CARE_PROVIDER_SITE_OTHER): Payer: BLUE CROSS/BLUE SHIELD | Admitting: Obstetrics & Gynecology

## 2017-02-12 VITALS — BP 102/58 | HR 72 | Resp 12 | Ht 63.75 in | Wt 110.5 lb

## 2017-02-12 DIAGNOSIS — Z124 Encounter for screening for malignant neoplasm of cervix: Secondary | ICD-10-CM | POA: Diagnosis not present

## 2017-02-12 DIAGNOSIS — Z01419 Encounter for gynecological examination (general) (routine) without abnormal findings: Secondary | ICD-10-CM | POA: Diagnosis not present

## 2017-02-12 MED ORDER — ESTRADIOL-NORETHINDRONE ACET 1-0.5 MG PO TABS: 1.0000 | ORAL_TABLET | Freq: Every day | ORAL | 12 refills | Status: DC

## 2017-02-12 NOTE — Progress Notes (Signed)
62 y.o. G45P2012 Divorced Caucasian F here for annual exam.  Doing well.  Has recurrent issues with internal hemorrhoids and bleeding.  Had undergoing hemorrhoid banding twice.    Selling home and looking for new job.  Hates job and the traveling.  Denies vaginal bleeding.    Patient's last menstrual period was 01/01/2005.          Sexually active: Yes.    The current method of family planning is post menopausal status.    Exercising: Yes.    walking Smoker:  no  Health Maintenance: Pap:  08/30/14 negative, HR HPV negative, 06/09/13 negative  History of abnormal Pap:  yes MMG:  02/07/17 BIRADS 1 negative, doing 3D MMG Colonoscopy:  02/11/15 polyps- repeat 5 years  BMD:   02/20/11 osteopenia  TDaP:  07/14/13  Pneumonia vaccine(s):  01/02/03  Shingrix:   no Hep C testing: 07/15/14 negative  Screening Labs: PCP, Hb today: PCP, Urine today: not collected    reports that  has never smoked. she has never used smokeless tobacco. She reports that she drinks about 5.4 - 9.6 oz of alcohol per week. She reports that she does not use drugs.  Past Medical History:  Diagnosis Date  . Endometrial polyp   . GERD (gastroesophageal reflux disease)   . History of adenomatous polyp of colon    multiple  . History of esophagitis    and duodenitis   12/ 2006  . Hyperlipidemia   . IBS (irritable bowel syndrome)   . Internal bleeding hemorrhoids - Gr 2 05/03/2015   Gr 2 RP LL RA - RP banded 05/03/2015 06/14/2015 RA and LL banded    . Internal bleeding hemorrhoids - Gr 2 05/03/2015   Gr 2 RP LL RA - RP banded 05/03/2015 06/14/2015 RA and LL banded 01/12/2016 RA and RP banded   . osteopenia   . PMB (postmenopausal bleeding)   . 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 Wears glasses     Past Surgical History:  Procedure Laterality Date  . BELPHAROPTOSIS REPAIR Left 2016   upper and lower   . BREAST ENHANCEMENT SURGERY  Oct 2016  . COLONOSCOPY  last one 02-11-2015  . DILATATION & CURETTAGE/HYSTEROSCOPY  WITH MYOSURE N/A 03/31/2015   Procedure: DILATATION & CURETTAGE/HYSTEROSCOPY WITH MYOSURE;  Surgeon: Megan Salon, MD;  Location: [facility]  Service: Gynecology;  Laterality: N/A;  endometrial polyp  . DILATION AND CURETTAGE OF UTERUS  1985   w/ suction  . ESOPHAGOGASTRODUODENOSCOPY  12-12-2004  . EUS N/A 03/10/2015   Procedure: LOWER ENDOSCOPIC ULTRASOUND (EUS);  Surgeon: [name], MD;  Location: [facility];  Service: Endoscopy;  Laterality: N/A;  . HEMORRHOID BANDING  2017  . TONSILLECTOMY  as child  . [place]   with Bilateral Tubal Ligation  . WRIST GANGLION EXCISION Left 10-04-2000    Current Outpatient Medications  Medication Sig Dispense Refill  . ALPRAZolam (XANAX) 0.5 MG tablet TAKE 1 TABLET BY MOUTH AT BEDTIME AS NEEDED 30 tablet 2  . aspirin EC 81 MG tablet Take 81 mg by mouth daily.    . Calcium Citrate (CALCITRATE PO) Take by mouth.    . fish oil-omega-3 fatty acids 1000 MG capsule Take 2 g by mouth daily.    . hyoscyamine (LEVSIN) 0.125 MG tablet Take 1 tablet (0.125 mg total) by mouth every 6 (six) hours as needed for cramping. 60 tablet 2  . loperamide (IMODIUM)  2 MG capsule Take by mouth as needed for diarrhea or loose stools. Reported on 02/02/2015    . MIMVEY 1-0.5 MG tablet TAKE 1 TABLET BY MOUTH DAILY. 28 tablet 3  . Multiple Vitamin (MULTIVITAMIN) capsule Take 1 capsule by mouth 2 (two) times daily. Reported on 02/02/2015    . Omeprazole-Sodium Bicarbonate (ZEGERID OTC PO) Take 1 capsule by mouth as needed. Reported on 02/02/2015     No current facility-administered medications for this visit.     Family History  Problem Relation Age of Onset  . Breast cancer Mother   . Hypertension Mother   . Osteoporosis Mother   . COPD Father   . Lung cancer Father   . Colon polyps Father   . Colon cancer Maternal Grandfather   . Colon cancer Maternal Aunt   . Colon cancer Paternal Aunt   . Colon polyps Sister   . Colon polyps  Brother     ROS:  Pertinent items are noted in HPI.  Otherwise, a comprehensive ROS was negative.  Exam:   BP (!) 102/58 (BP Location: Right Arm, Patient Position: Sitting, Cuff Size: Normal)   Pulse 72   Resp 12   Ht 5' 3.75" (1.619 m)   Wt 110 lb 8 oz (50.1 kg)   LMP 01/01/2005   BMI 19.12 kg/m     Height: 5' 3.75" (161.9 cm)  Ht Readings from Last 3 Encounters:  02/12/17 5' 3.75" (1.619 m)  11/08/16 5' 4.5" (1.638 m)  01/12/16 5\' 4"  (1.626 m)    General appearance: alert, cooperative and appears stated age Head: Normocephalic, without obvious abnormality, atraumatic Neck: no adenopathy, supple, symmetrical, trachea midline and thyroid normal to inspection and palpation Lungs: clear to auscultation bilaterally Breasts: normal appearance, no masses or tenderness, bilateral implants Heart: regular rate and rhythm Abdomen: soft, non-tender; bowel sounds normal; no masses,  no organomegaly Extremities: extremities normal, atraumatic, no cyanosis or edema Skin: Skin color, texture, turgor normal. No rashes or lesions Lymph nodes: Cervical, supraclavicular, and axillary nodes normal. No abnormal inguinal nodes palpated Neurologic: Grossly normal  Pelvic: External genitalia:  no lesions              Urethra:  normal appearing urethra with no masses, tenderness or lesions              Bartholins and Skenes: normal                 Vagina: normal appearing vagina with normal color and discharge, no lesions              Cervix: no lesions              Pap taken: Yes.   Bimanual Exam:  Uterus:  normal size, contour, position, consistency, mobility, non-tender              Adnexa: normal adnexa and no mass, fullness, tenderness               Rectovaginal: Confirms               Anus:  normal sphincter tone, no lesions  Chaperone was present for exam.  A:  Well Woman with normal exam  PMP, on HRT H/O internal hemorrhoids and adenomatous colon polyps GERD Carcinoid rectal mass  removed 3/17 with Dr. Ardis Hughs. Pathology with negative margins.  Colonoscopy due five years.  P:   Mammogram guidelines reviewed.  Doing 3D MMG. pap smear obtained today Colonoscopy screening UTD [place] 1/0.5mg  .  Pt taking 1/2 tab daily.  #30/12RF Lab work UTD Will do BMD next year with MMG. D/w pt shingrix vaccine.  Will consider and check with insurance coverage. return annually or prn

## 2017-02-13 LAB — CYTOLOGY - PAP: Diagnosis: NEGATIVE

## 2017-02-18 NOTE — Addendum Note (Signed)
Addended by: Polly Cobia on: 02/18/2017 05:22 PM   Modules accepted: Orders

## 2017-03-19 ENCOUNTER — Ambulatory Visit: Payer: BLUE CROSS/BLUE SHIELD | Admitting: Obstetrics & Gynecology

## 2017-05-05 ENCOUNTER — Other Ambulatory Visit: Payer: Self-pay | Admitting: Internal Medicine

## 2017-06-27 ENCOUNTER — Ambulatory Visit (INDEPENDENT_AMBULATORY_CARE_PROVIDER_SITE_OTHER): Payer: BLUE CROSS/BLUE SHIELD | Admitting: Obstetrics & Gynecology

## 2017-06-27 ENCOUNTER — Encounter: Payer: Self-pay | Admitting: Obstetrics & Gynecology

## 2017-06-27 ENCOUNTER — Telehealth: Payer: Self-pay | Admitting: Obstetrics & Gynecology

## 2017-06-27 VITALS — BP 110/70 | HR 72 | Ht 60.0 in | Wt 112.0 lb

## 2017-06-27 DIAGNOSIS — R300 Dysuria: Secondary | ICD-10-CM | POA: Diagnosis not present

## 2017-06-27 DIAGNOSIS — N898 Other specified noninflammatory disorders of vagina: Secondary | ICD-10-CM

## 2017-06-27 LAB — POCT URINALYSIS DIPSTICK
Bilirubin, UA: NEGATIVE
Blood, UA: NEGATIVE
Glucose, UA: NEGATIVE
Ketones, UA: NEGATIVE
Nitrite, UA: NEGATIVE
Protein, UA: NEGATIVE
Urobilinogen, UA: NEGATIVE E.U./dL — AB
pH, UA: 5 (ref 5.0–8.0)

## 2017-06-27 MED ORDER — TERCONAZOLE 0.4 % VA CREA: 1.0000 | TOPICAL_CREAM | Freq: Every day | VAGINAL | 0 refills | Status: DC

## 2017-06-27 MED ORDER — NITROFURANTOIN MONOHYD MACRO 100 MG PO CAPS: 100.0000 mg | ORAL_CAPSULE | Freq: Two times a day (BID) | ORAL | 0 refills | Status: DC

## 2017-06-27 NOTE — Telephone Encounter (Signed)
Patient reports burning inside vagina with pressure. Is unsure if burning is with urination or when urine touches skin. Symptoms started on 6/26. Used a vinegar douche yesterday, caused more irritation. Requesting OV.  Denies lower back pain, fever/chills, bleeding, N/V, blood in urine, vag d/c or odor.  OV scheduled for today at 3:30pm with Dr. Sabra Heck.   Patient is agreeable to disposition. Will close encounter.

## 2017-06-27 NOTE — Progress Notes (Signed)
GYNECOLOGY  VISIT  CC:   Vaginal burning sensation  HPI: 62 y.o. G54P2012 Divorced Caucasian female here for four day complaint of dysuria, vaginal burning and pressure.  This has gotten worse over the past four days.  Doesn't having any itching.  No back pain or fevers.  She is having some increased discharge.  Urine seems the same--no change in color or odor.     GYNECOLOGIC HISTORY: Patient's last menstrual period was 01/01/2005. Contraception: post menopausal Menopausal hormone therapy:   Patient Active Problem List   Diagnosis Date Noted  . Insomnia   . Low bone mass   . Menopausal symptom   . Hyperlipidemia 02/18/2007  . GERD 02/18/2007  . IBS 02/18/2007  . FIBROCYSTIC BREAST DISEASE 02/18/2007  . COLONIC POLYPS, ADENOMATOUS 12/12/2004    Past Medical History:  Diagnosis Date  . Endometrial polyp   . GERD (gastroesophageal reflux disease)   . History of adenomatous polyp of colon    multiple  . History of esophagitis    and duodenitis   12/ 2006  . Hyperlipidemia   . IBS (irritable bowel syndrome)   . Internal bleeding hemorrhoids - Gr 2 05/03/2015   Gr 2 RP LL RA - RP banded 05/03/2015 06/14/2015 RA and LL banded    . Internal bleeding hemorrhoids - Gr 2 05/03/2015   Gr 2 RP LL RA - RP banded 05/03/2015 06/14/2015 RA and LL banded 01/12/2016 RA and RP banded   . osteopenia   . PMB (postmenopausal bleeding)   . 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 Wears glasses     Past Surgical History:  Procedure Laterality Date  . BELPHAROPTOSIS REPAIR Left 2016   upper and lower   . BREAST ENHANCEMENT SURGERY  Oct 2016  . COLONOSCOPY  last one 02-11-2015  . DILATATION & CURETTAGE/HYSTEROSCOPY WITH MYOSURE N/A 03/31/2015   Procedure: DILATATION & CURETTAGE/HYSTEROSCOPY WITH MYOSURE;  Surgeon: Megan Salon, MD;  Location: [facility];  Service: Gynecology;  Laterality: N/A;  endometrial polyp  . DILATION AND CURETTAGE OF UTERUS  1985   w/ suction  .  ESOPHAGOGASTRODUODENOSCOPY  12-12-2004  . EUS N/A 03/10/2015   Procedure: LOWER ENDOSCOPIC ULTRASOUND (EUS);  Surgeon: [name], MD;  Location: [facility];  Service: Endoscopy;  Laterality: N/A;  . HEMORRHOID BANDING  2017  . TONSILLECTOMY  as child  . [place]   with Bilateral Tubal Ligation  . WRIST GANGLION EXCISION Left 10-04-2000    MEDS:   Current Outpatient Medications on File Prior to Visit  Medication Sig Dispense Refill  . ALPRAZolam (XANAX) 0.5 MG tablet TAKE 1 TABLET AT BEDTIME 30 tablet 0  . estradiol-norethindrone (MIMVEY) 1-0.5 MG tablet Take 1 tablet by mouth daily. 28 tablet 12  . fish oil-omega-3 fatty acids 1000 MG capsule Take 2 g by mouth daily.    . hyoscyamine (LEVSIN) 0.125 MG tablet Take 1 tablet (0.125 mg total) by mouth every 6 (six) hours as needed for cramping. 60 tablet 2  . loperamide (IMODIUM) 2 MG capsule Take by mouth as needed for diarrhea or loose stools. Reported on 02/02/2015    . Multiple Vitamin (MULTIVITAMIN) capsule Take 1 capsule by mouth 2 (two) times daily. Reported on 02/02/2015    . Omeprazole-Sodium Bicarbonate (ZEGERID OTC PO) Take 1 capsule by mouth as needed. Reported on 02/02/2015    . aspirin EC 81 MG tablet Take 81 mg by mouth daily.    Marland Kitchen  Calcium Citrate (CALCITRATE PO) Take by mouth.     No current facility-administered medications on file prior to visit.     ALLERGIES: Sulfa antibiotics  Family History  Problem Relation Age of Onset  . Breast cancer Mother   . Hypertension Mother   . Osteoporosis Mother   . COPD Father   . Lung cancer Father   . Colon polyps Father   . Colon cancer Maternal Grandfather   . Colon cancer Maternal Aunt   . Colon cancer Paternal Aunt   . Colon polyps Sister   . Colon polyps Brother     SH:  Divorced, non smoker  Review of Systems  Constitutional: Negative.   HENT: Negative.   Eyes: Negative.   Respiratory: Negative.   Cardiovascular: Negative.    Gastrointestinal: Negative.   Genitourinary: Negative.        Abnormal vaginal discharge   Musculoskeletal: Negative.   Skin: Negative.   Neurological: Negative.   Endo/Heme/Allergies: Negative.   Psychiatric/Behavioral: Negative.     PHYSICAL EXAMINATION:    BP 110/70   Pulse 72   Ht 5' (1.524 m)   Wt 112 lb (50.8 kg)   LMP 01/01/2005   BMI 21.87 kg/m     General appearance: alert, cooperative and appears stated age Lymph:  No vaginal discharge  Pelvic: External genitalia:  no lesions              Urethra:  normal appearing urethra with no masses, tenderness or lesions              Bartholins and Skenes: normal                 Vagina: normal appearing vagina with normal color and scant discharge, no lesions              Cervix: no lesions              Bimanual Exam:  Uterus:  normal size, contour, position, consistency, mobility, non-tender              Adnexa: no mass, fullness, tenderness  Chaperone was present for exam.  Assessment: Vaginal burning Scant vaginal discharge Mild dysuria  Plan: Urine culture and micro pending.  Will treat with macrobid 100mg  bid x 5 days until culture is back Affirm testing obtained.  Terazol 7 ordered.  Will follow up with pt in 24-48 hours.

## 2017-06-27 NOTE — Telephone Encounter (Signed)
Patient requesting an appointment today for a possible UTI.

## 2017-06-28 ENCOUNTER — Other Ambulatory Visit: Payer: Self-pay | Admitting: Obstetrics & Gynecology

## 2017-06-28 ENCOUNTER — Telehealth: Payer: Self-pay | Admitting: Obstetrics & Gynecology

## 2017-06-28 LAB — URINALYSIS, MICROSCOPIC ONLY: Casts: NONE SEEN /lpf

## 2017-06-28 LAB — VAGINITIS/VAGINOSIS, DNA PROBE
Candida Species: NEGATIVE
Gardnerella vaginalis: NEGATIVE
Trichomonas vaginosis: NEGATIVE

## 2017-06-28 LAB — URINE CULTURE: Organism ID, Bacteria: NO GROWTH

## 2017-06-28 MED ORDER — ESTRADIOL 0.1 MG/GM VA CREA: TOPICAL_CREAM | VAGINAL | 1 refills | Status: DC

## 2017-06-28 NOTE — Telephone Encounter (Signed)
I spoke to pt personally and will start estrace cream externally twice daily.  Urine culture still pending.  She will stay on antibiotics until the culture is back.  Advised her I would touch base again this weekend.  Thanks.

## 2017-06-28 NOTE — Telephone Encounter (Signed)
Spoke with patient, requesting results DOS 06/27/17.   Advised patient vaginitis testing negative for yeast, BV & trich. Awaiting UC results, can take up to 3 days. Patient started Macrobid and terazol last night. Advised patient testing neg for yeast, can stop terazol. Dr. Sabra Heck still needs to review labs, will return call if any additional  recommendations. Otherwise, will wait for final UC results.  Patient verbalizes understanding.    Routing to Dr. Sabra Heck for review.

## 2017-06-28 NOTE — Telephone Encounter (Signed)
Patient calling requesting results from yesterday. Patient was told that they would be ready today.

## 2017-06-30 ENCOUNTER — Other Ambulatory Visit: Payer: Self-pay | Admitting: Obstetrics & Gynecology

## 2017-06-30 MED ORDER — TRIAMCINOLONE ACETONIDE 0.1 % EX OINT: 1.0000 "application " | TOPICAL_OINTMENT | Freq: Two times a day (BID) | CUTANEOUS | 0 refills | Status: DC

## 2017-06-30 MED ORDER — HYDROCORTISONE ACETATE 25 MG RE SUPP: RECTAL | 0 refills | Status: DC

## 2017-07-01 ENCOUNTER — Telehealth: Payer: Self-pay | Admitting: Internal Medicine

## 2017-07-01 NOTE — Telephone Encounter (Signed)
See result note dated 6/30. Dr. Sabra Heck notified patient of final results. Encounter closed.

## 2017-07-01 NOTE — Telephone Encounter (Signed)
Patient would like to come in to discuss banding.  She will come 07/03/17

## 2017-07-02 ENCOUNTER — Ambulatory Visit (INDEPENDENT_AMBULATORY_CARE_PROVIDER_SITE_OTHER): Payer: BLUE CROSS/BLUE SHIELD | Admitting: Obstetrics & Gynecology

## 2017-07-02 ENCOUNTER — Encounter: Payer: Self-pay | Admitting: Obstetrics & Gynecology

## 2017-07-02 ENCOUNTER — Other Ambulatory Visit: Payer: Self-pay

## 2017-07-02 VITALS — BP 110/66 | HR 72 | Resp 12 | Ht 64.5 in | Wt 112.0 lb

## 2017-07-02 DIAGNOSIS — N766 Ulceration of vulva: Secondary | ICD-10-CM | POA: Diagnosis not present

## 2017-07-02 MED ORDER — VALACYCLOVIR HCL 1 G PO TABS: 1000.0000 mg | ORAL_TABLET | Freq: Two times a day (BID) | ORAL | 0 refills | Status: DC

## 2017-07-02 MED ORDER — LIDOCAINE 5 % EX OINT: 1.0000 "application " | TOPICAL_OINTMENT | Freq: Three times a day (TID) | CUTANEOUS | 0 refills | Status: DC

## 2017-07-02 NOTE — Progress Notes (Signed)
GYNECOLOGY  VISIT  CC:   Vulvar recheck  HPI: 62 y.o. G26P2012 Divorced Caucasian female here for follow up of vaginal burning.  Still having a lot of issues with urinating.  Using water when she voids to help.  Feels the steroid has helped juts a little.  Having increased vaginal discharge.  Has noted a little bleeding as well when she wipes.  Has felt achy and tired with some low back pain but that has resolved.  Has not had a fever or chills.  Urine culture was negative.  Vaginitis testing was negative.    Also feels like she is having a hemorrhoid flare.  Having tenderness but no bleeding.  Called Dr. Celesta Aver office for having a banding procedure tomorrow.    GYNECOLOGIC HISTORY: Patient's last menstrual period was 01/01/2005. Contraception: post menopausal  Menopausal hormone therapy: estrace  Patient Active Problem List   Diagnosis Date Noted  . Insomnia   . Low bone mass   . Menopausal symptom   . Hyperlipidemia 02/18/2007  . GERD 02/18/2007  . IBS 02/18/2007  . FIBROCYSTIC BREAST DISEASE 02/18/2007  . COLONIC POLYPS, ADENOMATOUS 12/12/2004    Past Medical History:  Diagnosis Date  . Endometrial polyp   . GERD (gastroesophageal reflux disease)   . History of adenomatous polyp of colon    multiple  . History of esophagitis    and duodenitis   12/ 2006  . Hyperlipidemia   . IBS (irritable bowel syndrome)   . Internal bleeding hemorrhoids - Gr 2 05/03/2015   Gr 2 RP LL RA - RP banded 05/03/2015 06/14/2015 RA and LL banded    . Internal bleeding hemorrhoids - Gr 2 05/03/2015   Gr 2 RP LL RA - RP banded 05/03/2015 06/14/2015 RA and LL banded 01/12/2016 RA and RP banded   . osteopenia   . PMB (postmenopausal bleeding)   . 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 Wears glasses     Past Surgical History:  Procedure Laterality Date  . BELPHAROPTOSIS REPAIR Left 2016   upper and lower   . BREAST ENHANCEMENT SURGERY  Oct 2016  . COLONOSCOPY  last one 02-11-2015  .  DILATATION & CURETTAGE/HYSTEROSCOPY WITH MYOSURE N/A 03/31/2015   Procedure: DILATATION & CURETTAGE/HYSTEROSCOPY WITH MYOSURE;  Surgeon: Megan Salon, MD;  Location: [facility];  Service: Gynecology;  Laterality: N/A;  endometrial polyp  . DILATION AND CURETTAGE OF UTERUS  1985   w/ suction  . ESOPHAGOGASTRODUODENOSCOPY  12-12-2004  . EUS N/A 03/10/2015   Procedure: LOWER ENDOSCOPIC ULTRASOUND (EUS);  Surgeon: [name], MD;  Location: [facility];  Service: Endoscopy;  Laterality: N/A;  . HEMORRHOID BANDING  2017  . TONSILLECTOMY  as child  . [place]   with Bilateral Tubal Ligation  . WRIST GANGLION EXCISION Left 10-04-2000    MEDS:   Current Outpatient Medications on File Prior to Visit  Medication Sig Dispense Refill  . ALPRAZolam (XANAX) 0.5 MG tablet TAKE 1 TABLET AT BEDTIME 30 tablet 0  . aspirin EC 81 MG tablet Take 81 mg by mouth daily.    . Calcium Citrate (CALCITRATE PO) Take by mouth.    . estradiol-norethindrone (MIMVEY) 1-0.5 MG tablet Take 1 tablet by mouth daily. 28 tablet 12  . fish oil-omega-3 fatty acids 1000 MG capsule Take 2 g by mouth daily.    . hydrocortisone (ANUSOL-HC) 25 MG suppository One suppository vaginally daily. 12 suppository 0  .  hyoscyamine (LEVSIN) 0.125 MG tablet Take 1 tablet (0.125 mg total) by mouth every 6 (six) hours as needed for cramping. 60 tablet 2  . loperamide (IMODIUM) 2 MG capsule Take by mouth as needed for diarrhea or loose stools. Reported on 02/02/2015    . Multiple Vitamin (MULTIVITAMIN) capsule Take 1 capsule by mouth 2 (two) times daily. Reported on 02/02/2015    . Omeprazole-Sodium Bicarbonate (ZEGERID OTC PO) Take 1 capsule by mouth as needed. Reported on 02/02/2015    . triamcinolone ointment (KENALOG) 0.1 % Apply 1 application topically 2 (two) times daily. 30 g 0  . estradiol (ESTRACE) 0.1 MG/GM vaginal cream Apply topically twice daily as directed. (Patient not taking: Reported on 07/02/2017)  42.5 g 1   No current facility-administered medications on file prior to visit.     ALLERGIES: Sulfa antibiotics  Family History  Problem Relation Age of Onset  . Breast cancer Mother   . Hypertension Mother   . Osteoporosis Mother   . COPD Father   . Lung cancer Father   . Colon polyps Father   . Colon cancer Maternal Grandfather   . Colon cancer Maternal Aunt   . Colon cancer Paternal Aunt   . Colon polyps Sister   . Colon polyps Brother     SH:  Divorced, non smoker  Review of Systems  Genitourinary: Positive for dysuria.       Vaginal irritation   All other systems reviewed and are negative.   PHYSICAL EXAMINATION:    BP 110/66 (BP Location: Right Arm, Patient Position: Sitting, Cuff Size: Normal)   Pulse 72   Resp 12   Ht 5' 4.5" (1.638 m)   Wt 112 lb (50.8 kg)   LMP 01/01/2005   BMI 18.93 kg/m     General appearance: alert, cooperative and appears stated age Lymph:  No inguinal LAD  Pelvic: External genitalia:  Multiple vessicular appearing lesions with some ulcerative lesions all with red one, one right beside the urethra--c/w HSV              Urethra:  normal appearing urethra with no masses, tenderness or lesions              Bartholins and Skenes: normal                 Vagina: normal appearing vagina with normal color and discharge, no lesions               Anus: no lesions  Chaperone was present for exam.  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  Rx to pharmacy. Recheck 1 week.       ~30 minutes spent with patient >50% of time was in face to face discussion of above including transmission, discussing with partner, possible suppressive therapy, and additional questions that she had today.

## 2017-07-03 ENCOUNTER — Encounter: Payer: BLUE CROSS/BLUE SHIELD | Admitting: Internal Medicine

## 2017-07-04 LAB — HERPES SIMPLEX VIRUS(HSV) DNA BY PCR
HSV 2 DNA: NEGATIVE
HSV-1 DNA: POSITIVE — AB

## 2017-07-05 ENCOUNTER — Telehealth: Payer: Self-pay | Admitting: Obstetrics & Gynecology

## 2017-07-05 NOTE — Telephone Encounter (Signed)
Spoke with patient. Patient states that she needs to reschedule her recheck appointment due to scheduling conflict with another appointment. Appointment moved to 07/11/2017 at 10 am with Dr.Miller. Patient is agreeable to date and time. Also would like to know if Cleocin will interact with Valtrex. Advised per review of Up to Date these two medications do not have any interaction and are safe to take together. Patient verbalizes understanding.  Routing to provider for final review. Patient agreeable to disposition. Will close encounter.

## 2017-07-05 NOTE — Telephone Encounter (Signed)
Patient needing to reschedule appointment on 07/11/17. She also has questions for a nurse. Did not give any further information.

## 2017-07-05 NOTE — Telephone Encounter (Signed)
Patient needing to reschedule appointment on 07/11/17.

## 2017-07-11 ENCOUNTER — Ambulatory Visit: Payer: BLUE CROSS/BLUE SHIELD | Admitting: Obstetrics & Gynecology

## 2017-07-11 ENCOUNTER — Ambulatory Visit (INDEPENDENT_AMBULATORY_CARE_PROVIDER_SITE_OTHER): Payer: BLUE CROSS/BLUE SHIELD | Admitting: Obstetrics & Gynecology

## 2017-07-11 ENCOUNTER — Other Ambulatory Visit: Payer: Self-pay

## 2017-07-11 ENCOUNTER — Encounter: Payer: Self-pay | Admitting: Obstetrics & Gynecology

## 2017-07-11 VITALS — BP 110/70 | HR 80 | Resp 16 | Ht 64.5 in | Wt 113.0 lb

## 2017-07-11 DIAGNOSIS — R102 Pelvic and perineal pain: Secondary | ICD-10-CM | POA: Diagnosis not present

## 2017-07-11 NOTE — Progress Notes (Signed)
GYNECOLOGY  VISIT  CC:   Recheck and review lab results  HPI: 62 y.o. G5P2012 Divorced Caucasian female here for follow up after having primary HSV outbreak.  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  Pt is now aware partner has hx of fever blisters.  He's never had any vulvar symptoms.  He was very upset by this diagnosis (for her) and she reports he's been "great".  She feels much better.  Other STD testing recommended.  She does not feel this is needed.  Future treatment strategies discussed including intermittent treatment for new symptoms vs suppressive therapy and typical indications.  She wants to wait and see if she has any future outbreaks.  Does want rx on hand.  Aware any future outbreaks will not be as severe as this first one.  Prodrome symptoms discussed as well.  GYNECOLOGIC HISTORY: Patient's last menstrual period was 01/01/2005. Contraception: post menopausal  Menopausal hormone therapy: Mimvey   Patient Active Problem List   Diagnosis Date Noted  . Insomnia   . Low bone mass   . Menopausal symptom   . Hyperlipidemia 02/18/2007  . GERD 02/18/2007  . IBS 02/18/2007  . FIBROCYSTIC BREAST DISEASE 02/18/2007  . COLONIC POLYPS, ADENOMATOUS 12/12/2004    Past Medical History:  Diagnosis Date  . Endometrial polyp   . GERD (gastroesophageal reflux disease)   . History of adenomatous polyp of colon    multiple  . History of esophagitis    and duodenitis   12/ 2006  . Hyperlipidemia   . IBS (irritable bowel syndrome)   . Internal bleeding hemorrhoids - Gr 2 05/03/2015   Gr 2 RP LL RA - RP banded 05/03/2015 06/14/2015 RA and LL banded    . Internal bleeding hemorrhoids - Gr 2 05/03/2015   Gr 2 RP LL RA - RP banded 05/03/2015 06/14/2015 RA and LL banded 01/12/2016 RA and RP banded   . osteopenia   . PMB (postmenopausal bleeding)   . 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 Wears glasses     Past Surgical History:  Procedure Laterality Date  . BELPHAROPTOSIS REPAIR  Left 2016   upper and lower   . BREAST ENHANCEMENT SURGERY  Oct 2016  . COLONOSCOPY  last one 02-11-2015  . DILATATION & CURETTAGE/HYSTEROSCOPY WITH MYOSURE N/A 03/31/2015   Procedure: DILATATION & CURETTAGE/HYSTEROSCOPY WITH MYOSURE;  Surgeon: Megan Salon, MD;  Location: [facility];  Service: Gynecology;  Laterality: N/A;  endometrial polyp  . DILATION AND CURETTAGE OF UTERUS  1985   w/ suction  . ESOPHAGOGASTRODUODENOSCOPY  12-12-2004  . EUS N/A 03/10/2015   Procedure: LOWER ENDOSCOPIC ULTRASOUND (EUS);  Surgeon: [name], MD;  Location: [facility];  Service: Endoscopy;  Laterality: N/A;  . HEMORRHOID BANDING  2017  . TONSILLECTOMY  as child  . [place]   with Bilateral Tubal Ligation  . WRIST GANGLION EXCISION Left 10-04-2000    MEDS:   Current Outpatient Medications on File Prior to Visit  Medication Sig Dispense Refill  . ALPRAZolam (XANAX) 0.5 MG tablet TAKE 1 TABLET AT BEDTIME 30 tablet 0  . amoxicillin (AMOXIL) 500 MG capsule Take 500 mg by mouth 3 (three) times daily.     Marland Kitchen aspirin EC 81 MG tablet Take 81 mg by mouth daily.    . Calcium Citrate (CALCITRATE PO) Take by mouth.    . estradiol-norethindrone (MIMVEY) 1-0.5 MG tablet Take 1 tablet by  mouth daily. 28 tablet 12  . fish oil-omega-3 fatty acids 1000 MG capsule Take 2 g by mouth daily.    . hydrocortisone (ANUSOL-HC) 25 MG suppository One suppository vaginally daily. 12 suppository 0  . hyoscyamine (LEVSIN) 0.125 MG tablet Take 1 tablet (0.125 mg total) by mouth every 6 (six) hours as needed for cramping. 60 tablet 2  . loperamide (IMODIUM) 2 MG capsule Take by mouth as needed for diarrhea or loose stools. Reported on 02/02/2015    . Multiple Vitamin (MULTIVITAMIN) capsule Take 1 capsule by mouth 2 (two) times daily. Reported on 02/02/2015    . Omeprazole-Sodium Bicarbonate (ZEGERID OTC PO) Take 1 capsule by mouth as needed. Reported on 02/02/2015     No current  facility-administered medications on file prior to visit.     ALLERGIES: Sulfa antibiotics  Family History  Problem Relation Age of Onset  . Breast cancer Mother   . Hypertension Mother   . Osteoporosis Mother   . COPD Father   . Lung cancer Father   . Colon polyps Father   . Colon cancer Maternal Grandfather   . Colon cancer Maternal Aunt   . Colon cancer Paternal Aunt   . Colon polyps Sister   . Colon polyps Brother     SH:  Divorced, non smoker  Review of Systems  All other systems reviewed and are negative.   PHYSICAL EXAMINATION:    BP 110/70 (BP Location: Right Arm, Patient Position: Sitting, Cuff Size: Normal)   Pulse 80   Resp 16   Ht 5' 4.5" (1.638 m)   Wt 113 lb (51.3 kg)   LMP 01/01/2005   BMI 19.10 kg/m     General appearance: alert, cooperative and appears stated age No physical exam performed today  Chaperone was present for exam.  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  Knows to call if has other outbreaks as then we would discuss suppressive therapy again Recommended additional STD testing which was declined today.   ~15 minutes spent with patient >50% of time was in face to face discussion of above.

## 2017-09-25 ENCOUNTER — Encounter: Payer: Self-pay | Admitting: Physician Assistant

## 2017-09-25 ENCOUNTER — Ambulatory Visit (INDEPENDENT_AMBULATORY_CARE_PROVIDER_SITE_OTHER): Payer: BLUE CROSS/BLUE SHIELD | Admitting: Physician Assistant

## 2017-09-25 VITALS — BP 114/76 | HR 78 | Temp 98.6°F | Ht 64.5 in | Wt 113.6 lb

## 2017-09-25 DIAGNOSIS — H9193 Unspecified hearing loss, bilateral: Secondary | ICD-10-CM

## 2017-09-25 DIAGNOSIS — C439 Malignant melanoma of skin, unspecified: Secondary | ICD-10-CM | POA: Diagnosis not present

## 2017-09-25 DIAGNOSIS — H6593 Unspecified nonsuppurative otitis media, bilateral: Secondary | ICD-10-CM | POA: Diagnosis not present

## 2017-09-25 DIAGNOSIS — J069 Acute upper respiratory infection, unspecified: Secondary | ICD-10-CM

## 2017-09-25 MED ORDER — FLUTICASONE PROPIONATE 50 MCG/ACT NA SUSP: 2.0000 | Freq: Every day | NASAL | 1 refills | Status: DC

## 2017-09-25 MED ORDER — AZITHROMYCIN 250 MG PO TABS: ORAL_TABLET | ORAL | 1 refills | Status: AC

## 2017-09-25 NOTE — Patient Instructions (Addendum)
Your ears and sinuses are connected by the eustachian tube. When your sinuses are inflamed, this can close off the tube and cause fluid to collect in your middle ear. This can then cause dizziness, popping, clicking, ringing, and echoing in your ears. This is often NOT an infection and does NOT require antibiotics, it is caused by inflammation so the treatments help the inflammation. This can take a long time to get better so please be patient.  Here are things you can do to help with this: - Try the Flonase or Nasonex. Remember to spray each nostril twice towards the outer part of your eye.  Do not sniff but instead pinch your nose and tilt your head back to help the medicine get into your sinuses.  The best time to do this is at bedtime.Stop if you get blurred vision or nose bleeds.  -While drinking fluids, pinch and hold nose close and swallow, to help open eustachian tubes to drain fluid behind ear drums. -Please pick one of the over the counter allergy medications below and take it once daily for allergies.  It will also help with fluid behind ear drums. Claritin or loratadine cheapest but likely the weakest  Zyrtec or certizine at night because it can make you sleepy The strongest is allegra or fexafinadine  Cheapest at [employer], [employer], [employer] -can use decongestant over the counter, please do not use if you have high blood pressure or certain heart conditions.   if worsening HA, changes vision/speech, imbalance, weakness go to the ER   What is the TMJ? The temporomandibular (tem-PUH-ro-man-DIB-yoo-ler) joint, or the TMJ, connects the upper and lower jawbones. This joint allows the jaw to open wide and move back and forth when you chew, talk, or yawn.There are also several muscles that help this joint move. There can be muscle tightness and pain in the muscle that can cause several symptoms.  What causes TMJ pain? There are many causes of TMJ pain. Repeated chewing (for example, chewing gum)  and clenching your teeth can cause pain in the joint. Some TMJ pain has no obvious cause. What can I do to ease the pain? There are many things you can do to help your pain get better. When you have pain:  Eat soft foods and stay away from chewy foods (for example, taffy) Try to use both sides of your mouth to chew Don't chew gum Massage Don't open your mouth wide (for example, during yawning or singing) Don't bite your cheeks or fingernails Lower your amount of stress and worry Applying a warm, damp washcloth to the joint may help. Over-the-counter pain medicines such as ibuprofen (one brand: Advil) or acetaminophen (one brand: Tylenol) might also help. Do not use these medicines if you are allergic to them or if your doctor told you not to use them. How can I stop the pain from coming back? When your pain is better, you can do these exercises to make your muscles stronger and to keep the pain from coming back:  Resisted mouth opening: Place your thumb or two fingers under your chin and open your mouth slowly, pushing up lightly on your chin with your thumb. Hold for three to six seconds. Close your mouth slowly. Resisted mouth closing: Place your thumbs under your chin and your two index fingers on the ridge between your mouth and the bottom of your chin. Push down lightly on your chin as you close your mouth. Tongue up: Slowly open and close your mouth while   keeping the tongue touching the roof of the mouth. Side-to-side jaw movement: Place an object about one fourth of an inch thick (for example, two tongue depressors) between your front teeth. Slowly move your jaw from side to side. Increase the thickness of the object as the exercise becomes easier Forward jaw movement: Place an object about one fourth of an inch thick between your front teeth and move the bottom jaw forward so that the bottom teeth are in front of the top teeth. Increase the thickness of the object as the exercise becomes  easier. These exercises should not be painful. If it hurts to do these exercises, stop doing them and talk to your family doctor.    

## 2017-09-25 NOTE — Progress Notes (Signed)
Subjective:    Patient ID: Valerie Hubbard, female    DOB: 27-Jan-1955, 62 y.o.   MRN: [id number]  HPI 62 y.o. WF presents with URI. She has had sore throat, sinus pressure, ear pain. She states she always feels that she has fluid in her ears and it is affecting her hearing, has been like this for 2-3 She also feels that she is having right upper back tooth pain, had left tooth pulled 2-4 weeks ago and she has a dentist appointment this afternoon.   Blood pressure 114/76, pulse 78, temperature 98.6 F (37 C), height 5' 4.5" (1.638 m), weight 113 lb 9.6 oz (51.5 kg), last menstrual period 01/01/2005, SpO2 99 %.  Medications Current Outpatient Medications on File Prior to Visit  Medication Sig  . ALPRAZolam (XANAX) 0.5 MG tablet TAKE 1 TABLET AT BEDTIME  . aspirin EC 81 MG tablet Take 81 mg by mouth daily.  . Calcium Citrate (CALCITRATE PO) Take by mouth.  . estradiol-norethindrone (MIMVEY) 1-0.5 MG tablet Take 1 tablet by mouth daily.  . fish oil-omega-3 fatty acids 1000 MG capsule Take 2 g by mouth daily.  . hydrocortisone (ANUSOL-HC) 25 MG suppository One suppository vaginally daily.  . hyoscyamine (LEVSIN) 0.125 MG tablet Take 1 tablet (0.125 mg total) by mouth every 6 (six) hours as needed for cramping.  . loperamide (IMODIUM) 2 MG capsule Take by mouth as needed for diarrhea or loose stools. Reported on 02/02/2015  . Multiple Vitamin (MULTIVITAMIN) capsule Take 1 capsule by mouth 2 (two) times daily. Reported on 02/02/2015  . Omeprazole-Sodium Bicarbonate (ZEGERID OTC PO) Take 1 capsule by mouth as needed. Reported on 02/02/2015   No current facility-administered medications on file prior to visit.     Problem list She has COLONIC POLYPS, ADENOMATOUS; Hyperlipidemia; GERD; IBS; FIBROCYSTIC BREAST DISEASE; Low bone mass; Menopausal symptom; Insomnia; and Melanoma ([place]) on their problem list.  Review of Systems  Constitutional: Negative for chills and diaphoresis.  HENT: Positive for  congestion, hearing loss, postnasal drip, sinus pressure and sneezing. Negative for ear pain and sore throat.   Respiratory: Negative for cough, chest tightness, shortness of breath and wheezing.   Cardiovascular: Negative.   Gastrointestinal: Negative.   Genitourinary: Negative.   Musculoskeletal: Negative for neck pain.  Neurological: Positive for headaches.       Objective:   Physical Exam  Constitutional: She is oriented to person, place, and time. She appears well-developed and well-nourished.  HENT:  Right Ear: Hearing and external ear normal. No mastoid tenderness. Tympanic membrane is injected. Tympanic membrane is not perforated, not erythematous, not retracted and not bulging. A middle ear effusion is present.  Left Ear: Hearing and external ear normal. No mastoid tenderness. Tympanic membrane is injected. Tympanic membrane is not perforated, not erythematous, not retracted and not bulging. A middle ear effusion is present.  Nose: Right sinus exhibits maxillary sinus tenderness. Left sinus exhibits maxillary sinus tenderness.  Mouth/Throat: Uvula is midline, oropharynx is clear and moist and mucous membranes are normal.  Eyes: Pupils are equal, round, and reactive to light. Conjunctivae and EOM are normal.  Neck: Neck supple.  Cardiovascular: Normal rate and regular rhythm.  Pulmonary/Chest: Effort normal and breath sounds normal. No respiratory distress. She has no wheezes.  Abdominal: Soft. Bowel sounds are normal.  Musculoskeletal: Normal range of motion.  Lymphadenopathy:    She has no cervical adenopathy.  Neurological: She is alert and oriented to person, place, and time.  Skin: Skin is warm and  dry.        Assessment & Plan:   Malignant melanoma, unspecified site ([place])  Bilateral otitis media with effusion and Decreased hearing of both ears Will refer to ENT for decrease hearing get on flonase  Upper respiratory tract infection, unspecified type -      fluticasone (FLONASE) 50 MCG/ACT nasal spray; Place 2 sprays into both nostrils at bedtime. -     azithromycin (ZITHROMAX) 250 MG tablet; Take 2 tablets (500 mg) on  Day 1,  followed by 1 tablet (250 mg) once daily on Days 2 through 5.

## 2017-11-11 NOTE — Progress Notes (Addendum)
Complete Physical  Assessment and Plan:    Osteopenia -     VITAMIN D 25 Hydroxy (Vit-D Deficiency, Fractures) DEXA due this year.  Hyperlipidemia -     CBC with Differential/Platelet -     BASIC METABOLIC PANEL WITH GFR -     Hepatic function panel -     TSH -     Lipid panel -     EKG 12-Lead  Gastroesophageal reflux disease with esophagitis Continue PPI   Menopausal symptom Controlled at this time  Internal bleeding hemorrhoids  Improved and follows with GI  Irritable bowel syndrome with diarrhea Doing well with this, has not had to use medication.  Controlling with diet. -     hyoscyamine (LEVSIN) 0.125 MG tablet; Take 1 tablet (0.125 mg total) by mouth every 6 (six) hours as needed for cramping.   Insomnia Controlled, sleep hygiene discussed  Malignant Melanoma:  Follow with Dermatology for this  Benign neoplasm of colon, unspecified part of colon UTD  Routine general medical examination at a health care facility  Medication management -     Magnesium Screening for Cardiovascular condition EKG, WNL.  Mild atrial changes, asymptomatic. Will monitor yearly.  Screening for hematuria or proteinuria -     Urinalysis, Routine w reflex microscopic (not at [facility]) -     Microalbumin / creatinine urine ratio  Follow up in one year for complete physical   Discussed med's effects and SE's. Screening labs and tests as requested with regular follow-up as recommended.  HPI 62 y.o. female  presents for a complete physical.  Reports she is following with Dr Bernerd Limbo for her left hip pain  Reports she is doing well with erh IBS symptoms.    Her blood pressure has been controlled at home, today their BP is BP: 110/62 She does workout, walks. She denies chest pain, shortness of breath, dizziness.  She is on cholesterol medication and denies myalgias. Her cholesterol is at goal. The cholesterol last visit was:   Lab Results  Component Value Date   CHOL 235 (H)  11/08/2016   HDL 104 11/08/2016   LDLCALC 114 (H) 11/08/2016   TRIG 73 11/08/2016   CHOLHDL 2.3 11/08/2016   Last A1C in the office was: 5.6 She is on minivelle and bASA, will get MGM in Oct Patient is on Vitamin D supplement.   Lab Results  Component Value Date   VD25OH 60 11/08/2016   Flutters were better with zegrid.  Has some anxiety, rarely takes xanax, has IBS, follows with Dr. Carlean Purl She states her job is stressful, she is traveling 2-3 weeks out of the month.  BMI is Body mass index is 19.84 kg/m., she is working on diet and exercise. Wt Readings from Last 3 Encounters:  11/12/17 115 lb 9.6 oz (52.4 kg)  09/25/17 113 lb 9.6 oz (51.5 kg)  07/11/17 113 lb (51.3 kg)    Current Medications:  Current Outpatient Medications on File Prior to Visit  Medication Sig Dispense Refill  . ALPRAZolam (XANAX) 0.5 MG tablet TAKE 1 TABLET AT BEDTIME 30 tablet 0  . aspirin EC 81 MG tablet Take 81 mg by mouth daily.    . Calcium Citrate (CALCITRATE PO) Take by mouth.    . estradiol-norethindrone (MIMVEY) 1-0.5 MG tablet Take 1 tablet by mouth daily. 28 tablet 12  . fish oil-omega-3 fatty acids 1000 MG capsule Take 2 g by mouth daily.    . fluticasone (FLONASE) 50 MCG/ACT nasal spray Place 2  sprays into both nostrils at bedtime. 16 g 1  . hydrocortisone (ANUSOL-HC) 25 MG suppository One suppository vaginally daily. 12 suppository 0  . hyoscyamine (LEVSIN) 0.125 MG tablet Take 1 tablet (0.125 mg total) by mouth every 6 (six) hours as needed for cramping. 60 tablet 2  . loperamide (IMODIUM) 2 MG capsule Take by mouth as needed for diarrhea or loose stools. Reported on 02/02/2015    . Multiple Vitamin (MULTIVITAMIN) capsule Take 1 capsule by mouth 2 (two) times daily. Reported on 02/02/2015    . Omeprazole-Sodium Bicarbonate (ZEGERID OTC PO) Take 1 capsule by mouth as needed. Reported on 02/02/2015     No current facility-administered medications on file prior to visit.     Allergies Allergies   Allergen Reactions  . Sulfa Antibiotics Other (See Comments)    Unknown childhood reaction    SURGICAL HISTORY She  has a past surgical history that includes Umbilical hernia repair (1995); Esophagogastroduodenoscopy (12-12-2004); EUS (N/A, 03/10/2015); Wrist ganglion excision (Left, 10-04-2000); Colonoscopy (last one 02-11-2015); Breast enhancement surgery (Oct 2016); Blepharoptosis repair (Left, 2016); Dilation and curettage of uterus (1985); Tonsillectomy (as child); Dilatation & curettage/hysteroscopy with myosure (N/A, 03/31/2015); and Hemorrhoid banding (2017). FAMILY HISTORY Her family history includes Breast cancer in her mother; COPD in her father; Colon cancer in her maternal aunt, maternal grandfather, and paternal aunt; Colon polyps in her brother, father, and sister; Hypertension in her mother; Lung cancer in her father; Osteoporosis in her mother. SOCIAL HISTORY She  reports that she has never smoked. She has never used smokeless tobacco. She reports that she drinks about 9.0 - 16.0 standard drinks of alcohol per week.   Medical History:    Review of Systems  Constitutional: Negative.  Negative for chills, diaphoresis, fever, malaise/fatigue and weight loss.  HENT: Negative.  Negative for congestion, ear discharge, ear pain, hearing loss, nosebleeds, sinus pain, sore throat and tinnitus.   Eyes: Negative.  Negative for blurred vision, double vision, photophobia, pain, discharge and redness.  Respiratory: Negative for cough, hemoptysis, sputum production, shortness of breath, wheezing and stridor.   Cardiovascular: Negative for chest pain, palpitations, orthopnea, claudication, leg swelling and PND.  Gastrointestinal: Negative.  Negative for abdominal pain, blood in stool, constipation, diarrhea, heartburn, melena, nausea and vomiting.  Genitourinary: Negative.  Negative for dysuria, flank pain, frequency, hematuria and urgency.  Musculoskeletal: Negative.  Negative for back pain,  falls, joint pain, myalgias and neck pain.  Skin: Negative for itching and rash.  Neurological: Negative.  Negative for dizziness, tingling, tremors, sensory change, speech change, focal weakness, seizures, loss of consciousness, weakness and headaches.  Endo/Heme/Allergies: Negative for environmental allergies and polydipsia. Does not bruise/bleed easily.  Psychiatric/Behavioral: Negative.  Negative for depression, hallucinations, memory loss, substance abuse and suicidal ideas. The patient is not nervous/anxious and does not have insomnia.     Physical Exam: Estimated body mass index is 19.84 kg/m as calculated from the following:   Height as of this encounter: 5\' 4"  (1.626 m).   Weight as of this encounter: 115 lb 9.6 oz (52.4 kg). BP 110/62   Pulse 64   Temp 97.8 F (36.6 C)   Resp 14   Ht 5\' 4"  (1.626 m)   Wt 115 lb 9.6 oz (52.4 kg)   LMP 01/01/2005   SpO2 96%   BMI 19.84 kg/m  General Appearance: Well nourished, in no apparent distress. Eyes: PERRLA, EOMs, conjunctiva no swelling or erythema, normal fundi and vessels. Sinuses: No Frontal/maxillary tenderness ENT/Mouth: Ext aud canals  clear, normal light reflex with TMs without erythema, bulging.  Good dentition. No erythema, swelling, or exudate on post pharynx. Tonsils not swollen or erythematous. Hearing normal.  Neck: Supple, thyroid normal. No bruits Respiratory: Respiratory effort normal, BS equal bilaterally without rales, rhonchi, wheezing or stridor. Cardio: RRR without murmurs, rubs or gallops. Brisk peripheral pulses without edema.  Chest: symmetric, with normal excursions and percussion. Breasts: Dr. Sabra Heck Abdomen: Soft, +BS. Non tender, no guarding, rebound, hernias, masses, or organomegaly. .  Lymphatics: Non tender without lymphadenopathy.  Genitourinary: defer Dr. Sabra Heck Musculoskeletal: Full ROM all peripheral extremities,5/5 strength, and normal gait. Skin: Warm, dry without rashes, lesions, ecchymosis.   Neuro: Cranial nerves intact, reflexes equal bilaterally. Normal muscle tone, no cerebellar symptoms. Sensation intact.  Psych: Awake and oriented X 3, normal affect, Insight and Judgment appropriate.   Health Maintenance:   Immunization History  Administered Date(s) Administered  . Pneumococcal Polysaccharide-23 01/02/2003  . Td 04/15/2003  . Tdap 07/14/2013   TDAP: 2015 Pneumovax: 1997 Prevnar 13 due at 65 Flu vaccine:  Shingrix/Zostavax: Discussed with patient   Pap: 2015 at OB/GYN 3D MGM: 11/2015 CAT C DEXA: normal 2013 on estrogen, works out, vitamin D at goal, will wait 1-2 more years per patient request Colonoscopy: 02/2015 EGD: 2006  Echo 2008 EF 60%, normal aorta 2013 MRI lumbar 2009 CXR 2016 Hep C: 2016, Neg HIV screening- Declined  DEE Dr. Sabra Heck, yearly Dentist  Dr. Sigmund Hazel q 6 months Patient Care Team: Unk Pinto, MD as PCP - General (Internal Medicine) Renee Pain, MD as Consulting Physician (Plastic Surgery) Gatha Mayer, MD as Consulting Physician (Gastroenterology) Megan Salon, MD as Consulting Physician (Gynecology) Rolm Bookbinder, MD as Consulting Physician (Dermatology)   EKG:   WNL, screen yearly   Garnet Sierras, NP 9:35 AM  [facility]

## 2017-11-12 ENCOUNTER — Ambulatory Visit (INDEPENDENT_AMBULATORY_CARE_PROVIDER_SITE_OTHER): Payer: BLUE CROSS/BLUE SHIELD | Admitting: Adult Health Nurse Practitioner

## 2017-11-12 ENCOUNTER — Encounter: Payer: Self-pay | Admitting: Adult Health Nurse Practitioner

## 2017-11-12 VITALS — BP 110/62 | HR 64 | Temp 97.8°F | Resp 14 | Ht 64.0 in | Wt 115.6 lb

## 2017-11-12 DIAGNOSIS — Z136 Encounter for screening for cardiovascular disorders: Secondary | ICD-10-CM | POA: Diagnosis not present

## 2017-11-12 DIAGNOSIS — G4700 Insomnia, unspecified: Secondary | ICD-10-CM

## 2017-11-12 DIAGNOSIS — Z0000 Encounter for general adult medical examination without abnormal findings: Secondary | ICD-10-CM

## 2017-11-12 DIAGNOSIS — Z1329 Encounter for screening for other suspected endocrine disorder: Secondary | ICD-10-CM

## 2017-11-12 DIAGNOSIS — M8580 Other specified disorders of bone density and structure, unspecified site: Secondary | ICD-10-CM | POA: Insufficient documentation

## 2017-11-12 DIAGNOSIS — K2100 Gastro-esophageal reflux disease with esophagitis, without bleeding: Secondary | ICD-10-CM

## 2017-11-12 DIAGNOSIS — Z13220 Encounter for screening for lipoid disorders: Secondary | ICD-10-CM

## 2017-11-12 DIAGNOSIS — Z79899 Other long term (current) drug therapy: Secondary | ICD-10-CM | POA: Diagnosis not present

## 2017-11-12 DIAGNOSIS — Z131 Encounter for screening for diabetes mellitus: Secondary | ICD-10-CM | POA: Diagnosis not present

## 2017-11-12 DIAGNOSIS — E785 Hyperlipidemia, unspecified: Secondary | ICD-10-CM

## 2017-11-12 DIAGNOSIS — Z1389 Encounter for screening for other disorder: Secondary | ICD-10-CM | POA: Diagnosis not present

## 2017-11-12 DIAGNOSIS — R7309 Other abnormal glucose: Secondary | ICD-10-CM

## 2017-11-12 DIAGNOSIS — I10 Essential (primary) hypertension: Secondary | ICD-10-CM | POA: Diagnosis not present

## 2017-11-12 DIAGNOSIS — K581 Irritable bowel syndrome with constipation: Secondary | ICD-10-CM

## 2017-11-12 DIAGNOSIS — C439 Malignant melanoma of skin, unspecified: Secondary | ICD-10-CM

## 2017-11-12 DIAGNOSIS — N951 Menopausal and female climacteric states: Secondary | ICD-10-CM

## 2017-11-12 DIAGNOSIS — K648 Other hemorrhoids: Secondary | ICD-10-CM

## 2017-11-12 NOTE — Patient Instructions (Signed)
Follow up in one year for complete physical Continue your healthy behaviors and exercise at the gym.  Cardio and weights for strength. Focus on healthy habits, not the number on the scale.  Consider Zyrtec at night or Claritin during the day to help with fluid/popping/cracking/ in your ears.   Preventive Care for Adults  A healthy lifestyle and preventive care can promote health and wellness. Preventive health guidelines for women include the following key practices.  A routine yearly physical is a good way to check with your health care provider about your health and preventive screening. It is a chance to share any concerns and updates on your health and to receive a thorough exam.  Visit your dentist for a routine exam and preventive care every 6 months. Brush your teeth twice a day and floss once a day. Good oral hygiene prevents tooth decay and gum disease.  The frequency of eye exams is based on your age, health, family medical history, use of contact lenses, and other factors. Follow your health care provider's recommendations for frequency of eye exams.  Eat a healthy diet. Foods like vegetables, fruits, whole grains, low-fat dairy products, and lean protein foods contain the nutrients you need without too many calories. Decrease your intake of foods high in solid fats, added sugars, and salt. Eat the right amount of calories for you. Get information about a proper diet from your health care provider, if necessary.  Regular physical exercise is one of the most important things you can do for your health. Most adults should get at least 150 minutes of moderate-intensity exercise (any activity that increases your heart rate and causes you to sweat) each week. In addition, most adults need muscle-strengthening exercises on 2 or more days a week.  Maintain a healthy weight. The body mass index (BMI) is a screening tool to identify possible weight problems. It provides an estimate of body fat  based on height and weight. Your health care provider can find your BMI and can help you achieve or maintain a healthy weight. For adults 20 years and older:  A BMI below 18.5 is considered underweight.  A BMI of 18.5 to 24.9 is normal.  A BMI of 25 to 29.9 is considered overweight.  A BMI of 30 and above is considered obese.  Maintain normal blood lipids and cholesterol levels by exercising and minimizing your intake of saturated fat. Eat a balanced diet with plenty of fruit and vegetables. Blood tests for lipids and cholesterol should begin at age 41 and be repeated every 5 years. If your lipid or cholesterol levels are high, you are over 50, or you are at high risk for heart disease, you may need your cholesterol levels checked more frequently. Ongoing high lipid and cholesterol levels should be treated with medicines if diet and exercise are not working.  If you smoke, find out from your health care provider how to quit. If you do not use tobacco, do not start.  Lung cancer screening is recommended for adults aged 66-80 years who are at high risk for developing lung cancer because of a history of smoking. A yearly low-dose CT scan of the lungs is recommended for people who have at least a 30-pack-year history of smoking and are a current smoker or have quit within the past 15 years. A pack year of smoking is smoking an average of 1 pack of cigarettes a day for 1 year (for example: 1 pack a day for 30 years or  2 packs a day for 15 years). Yearly screening should continue until the smoker has stopped smoking for at least 15 years. Yearly screening should be stopped for people who develop a health problem that would prevent them from having lung cancer treatment.  High blood pressure causes heart disease and increases the risk of stroke. Your blood pressure should be checked at least every 1 to 2 years. Ongoing high blood pressure should be treated with medicines if weight loss and exercise do not  work.  If you are 32-56 years old, ask your health care provider if you should take aspirin to prevent strokes.  Diabetes screening involves taking a blood sample to check your fasting blood sugar level. This should be done once every 3 years, after age 20, if you are within normal weight and without risk factors for diabetes. Testing should be considered at a younger age or be carried out more frequently if you are overweight and have at least 1 risk factor for diabetes.  Breast cancer screening is essential preventive care for women. You should practice "breast self-awareness." This means understanding the normal appearance and feel of your breasts and may include breast self-examination. Any changes detected, no matter how small, should be reported to a health care provider. Women in their 52s and 30s should have a clinical breast exam (CBE) by a health care provider as part of a regular health exam every 1 to 3 years. After age 56, women should have a CBE every year. Starting at age 61, women should consider having a mammogram (breast X-ray test) every year. Women who have a family history of breast cancer should talk to their health care provider about genetic screening. Women at a high risk of breast cancer should talk to their health care providers about having an MRI and a mammogram every year.  Breast cancer gene (BRCA)-related cancer risk assessment is recommended for women who have family members with BRCA-related cancers. BRCA-related cancers include breast, ovarian, tubal, and peritoneal cancers. Having family members with these cancers may be associated with an increased risk for harmful changes (mutations) in the breast cancer genes BRCA1 and BRCA2. Results of the assessment will determine the need for genetic counseling and BRCA1 and BRCA2 testing.  Routine pelvic exams to screen for cancer are no longer recommended for nonpregnant women who are considered low risk for cancer of the pelvic  organs (ovaries, uterus, and vagina) and who do not have symptoms. Ask your health care provider if a screening pelvic exam is right for you.  If you have had past treatment for cervical cancer or a condition that could lead to cancer, you need Pap tests and screening for cancer for at least 20 years after your treatment. If Pap tests have been discontinued, your risk factors (such as having a new sexual partner) need to be reassessed to determine if screening should be resumed. Some women have medical problems that increase the chance of getting cervical cancer. In these cases, your health care provider may recommend more frequent screening and Pap tests.  Colorectal cancer can be detected and often prevented. Most routine colorectal cancer screening begins at the age of 52 years and continues through age 87 years. However, your health care provider may recommend screening at an earlier age if you have risk factors for colon cancer. On a yearly basis, your health care provider may provide home test kits to check for hidden blood in the stool. Use of a small camera at the  end of a tube, to directly examine the colon (sigmoidoscopy or colonoscopy), can detect the earliest forms of colorectal cancer. Talk to your health care provider about this at age 36, when routine screening begins.  Direct exam of the colon should be repeated every 5-10 years through age 62 years, unless early forms of pre-cancerous polyps or small growths are found.  Hepatitis C blood testing is recommended for all people born from [birth year] through 1965 and any individual with known risks for hepatitis C.  Pra  Osteoporosis is a disease in which the bones lose minerals and strength with aging. This can result in serious bone fractures or breaks. The risk of osteoporosis can be identified using a bone density scan. Women ages 47 years and over and women at risk for fractures or osteoporosis should discuss screening with their health care  providers. Ask your health care provider whether you should take a calcium supplement or vitamin D to reduce the rate of osteoporosis.  Menopause can be associated with physical symptoms and risks. Hormone replacement therapy is available to decrease symptoms and risks. You should talk to your health care provider about whether hormone replacement therapy is right for you.  Use sunscreen. Apply sunscreen liberally and repeatedly throughout the day. You should seek shade when your shadow is shorter than you. Protect yourself by wearing long sleeves, pants, a wide-brimmed hat, and sunglasses year round, whenever you are outdoors.  Once a month, do a whole body skin exam, using a mirror to look at the skin on your back. Tell your health care provider of new moles, moles that have irregular borders, moles that are larger than a pencil eraser, or moles that have changed in shape or color.  Stay current with required vaccines (immunizations).  Influenza vaccine. All adults should be immunized every year.  Tetanus, diphtheria, and acellular pertussis (Td, Tdap) vaccine. Pregnant women should receive 1 dose of Tdap vaccine during each pregnancy. The dose should be obtained regardless of the length of time since the last dose. Immunization is preferred during the 27th-36th week of gestation. An adult who has not previously received Tdap or who does not know her vaccine status should receive 1 dose of Tdap. This initial dose should be followed by tetanus and diphtheria toxoids (Td) booster doses every 10 years. Adults with an unknown or incomplete history of completing a 3-dose immunization series with Td-containing vaccines should begin or complete a primary immunization series including a Tdap dose. Adults should receive a Td booster every 10 years.  Varicella vaccine. An adult without evidence of immunity to varicella should receive 2 doses or a second dose if she has previously received 1 dose. Pregnant  females who do not have evidence of immunity should receive the first dose after pregnancy. This first dose should be obtained before leaving the health care facility. The second dose should be obtained 4-8 weeks after the first dose.  Human papillomavirus (HPV) vaccine. Females aged 13-26 years who have not received the vaccine previously should obtain the 3-dose series. The vaccine is not recommended for use in pregnant females. However, pregnancy testing is not needed before receiving a dose. If a female is found to be pregnant after receiving a dose, no treatment is needed. In that case, the remaining doses should be delayed until after the pregnancy. Immunization is recommended for any person with an immunocompromised condition through the age of 35 years if she did not get any or all doses earlier. During the 3-dose  series, the second dose should be obtained 4-8 weeks after the first dose. The third dose should be obtained 24 weeks after the first dose and 16 weeks after the second dose.  Zoster vaccine. One dose is recommended for adults aged 22 years or older unless certain conditions are present.  Measles, mumps, and rubella (MMR) vaccine. Adults born before [birth year] generally are considered immune to measles and mumps. Adults born in [birth year] or later should have 1 or more doses of MMR vaccine unless there is a contraindication to the vaccine or there is laboratory evidence of immunity to each of the three diseases. A routine second dose of MMR vaccine should be obtained at least 28 days after the first dose for students attending postsecondary schools, health care workers, or international travelers. People who received inactivated measles vaccine or an unknown type of measles vaccine during 1963-1967 should receive 2 doses of MMR vaccine. People who received inactivated mumps vaccine or an unknown type of mumps vaccine before 1979 and are at high risk for mumps infection should consider immunization with 2  doses of MMR vaccine. For females of childbearing age, rubella immunity should be determined. If there is no evidence of immunity, females who are not pregnant should be vaccinated. If there is no evidence of immunity, females who are pregnant should delay immunization until after pregnancy. Unvaccinated health care workers born before [birth year] who lack laboratory evidence of measles, mumps, or rubella immunity or laboratory confirmation of disease should consider measles and mumps immunization with 2 doses of MMR vaccine or rubella immunization with 1 dose of MMR vaccine.  Pneumococcal 13-valent conjugate (PCV13) vaccine. When indicated, a person who is uncertain of her immunization history and has no record of immunization should receive the PCV13 vaccine. An adult aged 58 years or older who has certain medical conditions and has not been previously immunized should receive 1 dose of PCV13 vaccine. This PCV13 should be followed with a dose of pneumococcal polysaccharide (PPSV23) vaccine. The PPSV23 vaccine dose should be obtained at least 1 or more year(s) after the dose of PCV13 vaccine. An adult aged 63 years or older who has certain medical conditions and previously received 1 or more doses of PPSV23 vaccine should receive 1 dose of PCV13. The PCV13 vaccine dose should be obtained 1 or more years after the last PPSV23 vaccine dose.    Pneumococcal polysaccharide (PPSV23) vaccine. When PCV13 is also indicated, PCV13 should be obtained first. All adults aged 46 years and older should be immunized. An adult younger than age 22 years who has certain medical conditions should be immunized. Any person who resides in a nursing home or long-term care facility should be immunized. An adult smoker should be immunized. People with an immunocompromised condition and certain other conditions should receive both PCV13 and PPSV23 vaccines. People with human immunodeficiency virus (HIV) infection should be immunized as soon  as possible after diagnosis. Immunization during chemotherapy or radiation therapy should be avoided. Routine use of PPSV23 vaccine is not recommended for American Indians, [place] Natives, or people younger than 65 years unless there are medical conditions that require PPSV23 vaccine. When indicated, people who have unknown immunization and have no record of immunization should receive PPSV23 vaccine. One-time revaccination 5 years after the first dose of PPSV23 is recommended for people aged 19-64 years who have chronic kidney failure, nephrotic syndrome, asplenia, or immunocompromised conditions. People who received 1-2 doses of PPSV23 before age 11 years should receive another dose of PPSV23  vaccine at age 1 years or later if at least 5 years have passed since the previous dose. Doses of PPSV23 are not needed for people immunized with PPSV23 at or after age 68 years.  Preventive Services / Frequency   Ages 15 to 59 years  Blood pressure check.  Lipid and cholesterol check.  Lung cancer screening. / Every year if you are aged 52-80 years and have a 30-pack-year history of smoking and currently smoke or have quit within the past 15 years. Yearly screening is stopped once you have quit smoking for at least 15 years or develop a health problem that would prevent you from having lung cancer treatment.  Clinical breast exam.** / Every year after age 80 years.   BRCA-related cancer risk assessment.** / For women who have family members with a BRCA-related cancer (breast, ovarian, tubal, or peritoneal cancers).  Mammogram.** / Every year beginning at age 22 years and continuing for as long as you are in good health. Consult with your health care provider.  Pap test.** / Every 3 years starting at age 27 years through age 102 or 72 years with a history of 3 consecutive normal Pap tests.  HPV screening.** / Every 3 years from ages 18 years through ages 71 to 34 years with a history of 3 consecutive  normal Pap tests.  Fecal occult blood test (FOBT) of stool. / Every year beginning at age 34 years and continuing until age 21 years. You may not need to do this test if you get a colonoscopy every 10 years.  Flexible sigmoidoscopy or colonoscopy.** / Every 5 years for a flexible sigmoidoscopy or every 10 years for a colonoscopy beginning at age 12 years and continuing until age 44 years.  Hepatitis C blood test.** / For all people born from [birth year] through 1965 and any individual with known risks for hepatitis C.  Skin self-exam. / Monthly.  Influenza vaccine. / Every year.  Tetanus, diphtheria, and acellular pertussis (Tdap/Td) vaccine.** / Consult your health care provider. Pregnant women should receive 1 dose of Tdap vaccine during each pregnancy. 1 dose of Td every 10 years.  Varicella vaccine.** / Consult your health care provider. Pregnant females who do not have evidence of immunity should receive the first dose after pregnancy.  Zoster vaccine.** / 1 dose for adults aged 54 years or older.  Pneumococcal 13-valent conjugate (PCV13) vaccine.** / Consult your health care provider.  Pneumococcal polysaccharide (PPSV23) vaccine.** / 1 to 2 doses if you smoke cigarettes or if you have certain conditions.  Meningococcal vaccine.** / Consult your health care provider.  Hepatitis A vaccine.** / Consult your health care provider.  Hepatitis B vaccine.** / Consult your health care provider. Screening for abdominal aortic aneurysm (AAA)  by ultrasound is recommended for people over 50 who have history of high blood pressure or who are current or former smokers. ++++++++++++++++++ Recommend Adult Low Dose Aspirin or  coated  Aspirin 81 mg daily  To reduce risk of Colon Cancer 20 %,  Skin Cancer 26 % ,  Melanoma 46%  and  Pancreatic cancer 60% +++++++++++++++++++ Vitamin D goal  is between 70-100.  Please make sure that you are taking your Vitamin D as directed.  It is very important  as a natural anti-inflammatory  helping hair, skin, and nails, as well as reducing stroke and heart attack risk.  It helps your bones and helps with mood. It also decreases numerous cancer risks so please take it as directed.  Low Vit D is associated with a 200-300% higher risk for CANCER  and 200-300% higher risk for HEART   ATTACK  &  STROKE.   .....................................Marland Kitchen It is also associated with higher death rate at younger ages,  autoimmune diseases like Rheumatoid arthritis, Lupus, Multiple Sclerosis.    Also many other serious conditions, like depression, Alzheimer's Dementia, infertility, muscle aches, fatigue, fibromyalgia - just to name a few. ++++++++++++++++++ Recommend the book "The END of DIETING" by Dr Excell Seltzer  & the book "The END of DIABETES " by Dr Excell Seltzer At [email or web address] - get book & Audio CD's    Being diabetic has a  300% increased risk for heart attack, stroke, cancer, and alzheimer- type vascular dementia. It is very important that you work harder with diet by avoiding all foods that are white. Avoid white rice (brown & wild rice is OK), white potatoes (sweetpotatoes in moderation is OK), White bread or wheat bread or anything made out of white flour like bagels, donuts, rolls, buns, biscuits, cakes, pastries, cookies, pizza crust, and pasta (made from white flour & egg whites) - vegetarian pasta or spinach or wheat pasta is OK. Multigrain breads like Arnold's or [employer], or multigrain sandwich thins or flatbreads.  Diet, exercise and weight loss can reverse and cure diabetes in the early stages.  Diet, exercise and weight loss is very important in the control and prevention of complications of diabetes which affects every system in your body, ie. Brain - dementia/stroke, eyes - glaucoma/blindness, heart - heart attack/heart failure, kidneys - dialysis, stomach - gastric paralysis, intestines - malabsorption, nerves - severe painful neuritis,  circulation - gangrene & loss of a leg(s), and finally cancer and Alzheimers.    I recommend avoid fried & greasy foods,  sweets/candy, white rice (brown or wild rice or Quinoa is OK), white potatoes (sweet potatoes are OK) - anything made from white flour - bagels, doughnuts, rolls, buns, biscuits,white and wheat breads, pizza crust and traditional pasta made of white flour & egg white(vegetarian pasta or spinach or wheat pasta is OK).  Multi-grain bread is OK - like multi-grain flat bread or sandwich thins. Avoid alcohol in excess. Exercise is also important.    Eat all the vegetables you want - avoid meat, especially red meat and dairy - especially cheese.  Cheese is the most concentrated form of trans-fats which is the worst thing to clog up our arteries. Veggie cheese is OK which can be found in the fresh produce section at [employer] or [employer] or [employer]  ++++++++++++++++++++++ DASH Eating Plan  DASH stands for "Dietary Approaches to Stop Hypertension."   The DASH eating plan is a healthy eating plan that has been shown to reduce high blood pressure (hypertension). Additional health benefits may include reducing the risk of type 2 diabetes mellitus, heart disease, and stroke. The DASH eating plan may also help with weight loss. WHAT DO I NEED TO KNOW ABOUT THE DASH EATING PLAN? For the DASH eating plan, you will follow these general guidelines:  Choose foods with a percent daily value for sodium of less than 5% (as listed on the food label).  Use salt-free seasonings or herbs instead of table salt or sea salt.  Check with your health care provider or pharmacist before using salt substitutes.  Eat lower-sodium products, often labeled as "lower sodium" or "no salt added."  Eat fresh foods.  Eat more vegetables, fruits, and low-fat dairy products.  Choose whole grains. Look for  the word "whole" as the first word in the ingredient list.  Choose fish   Limit sweets,  desserts, sugars, and sugary drinks.  Choose heart-healthy fats.  Eat veggie cheese   Eat more home-cooked food and less restaurant, buffet, and fast food.  Limit fried foods.  Cook foods using methods other than frying.  Limit canned vegetables. If you do use them, rinse them well to decrease the sodium.  When eating at a restaurant, ask that your food be prepared with less salt, or no salt if possible.                      WHAT FOODS CAN I EAT? Read Dr Fara Olden Fuhrman's books on The End of Dieting & The End of Diabetes  Grains Whole grain or whole wheat bread. Brown rice. Whole grain or whole wheat pasta. Quinoa, bulgur, and whole grain cereals. Low-sodium cereals. Corn or whole wheat flour tortillas. Whole grain cornbread. Whole grain crackers. Low-sodium crackers.  Vegetables Fresh or frozen vegetables (raw, steamed, roasted, or grilled). Low-sodium or reduced-sodium tomato and vegetable juices. Low-sodium or reduced-sodium tomato sauce and paste. Low-sodium or reduced-sodium canned vegetables.   Fruits All fresh, canned (in natural juice), or frozen fruits.  Protein Products  All fish and seafood.  Dried beans, peas, or lentils. Unsalted nuts and seeds. Unsalted canned beans.  Dairy Low-fat dairy products, such as skim or 1% milk, 2% or reduced-fat cheeses, low-fat ricotta or cottage cheese, or plain low-fat yogurt. Low-sodium or reduced-sodium cheeses.  Fats and Oils Tub margarines without trans fats. Light or reduced-fat mayonnaise and salad dressings (reduced sodium). Avocado. Safflower, olive, or canola oils. Natural peanut or almond butter.  Other Unsalted popcorn and pretzels. The items listed above may not be a complete list of recommended foods or beverages. Contact your dietitian for more options.  ++++++++++++++++++  WHAT FOODS ARE NOT RECOMMENDED? Grains/ White flour or wheat flour White bread. White pasta. White rice. Refined cornbread. Bagels and  croissants. Crackers that contain trans fat.  Vegetables  Creamed or fried vegetables. Vegetables in a . Regular canned vegetables. Regular canned tomato sauce and paste. Regular tomato and vegetable juices.  Fruits Dried fruits. Canned fruit in light or heavy syrup. Fruit juice.  Meat and Other Protein Products Meat in general - RED meat & White meat.  Fatty cuts of meat. Ribs, chicken wings, all processed meats as bacon, sausage, bologna, salami, fatback, hot dogs, bratwurst and packaged luncheon meats.  Dairy Whole or 2% milk, cream, half-and-half, and cream cheese. Whole-fat or sweetened yogurt. Full-fat cheeses or blue cheese. Non-dairy creamers and whipped toppings. Processed cheese, cheese spreads, or cheese curds.  Condiments Onion and garlic salt, seasoned salt, table salt, and sea salt. Canned and packaged gravies. Worcestershire sauce. Tartar sauce. Barbecue sauce. Teriyaki sauce. Soy sauce, including reduced sodium. Steak sauce. Fish sauce. Oyster sauce. Cocktail sauce. Horseradish. Ketchup and mustard. Meat flavorings and tenderizers. Bouillon cubes. Hot sauce. Tabasco sauce. Marinades. Taco seasonings. Relishes.  Fats and Oils Butter, stick margarine, lard, shortening and bacon fat. Coconut, palm kernel, or palm oils. Regular salad dressings.  Pickles and olives. Salted popcorn and pretzels.  The items listed above may not be a complete list of foods and beverages to avoid.

## 2017-11-13 LAB — CBC WITH DIFFERENTIAL/PLATELET
Basophils Absolute: 122 cells/uL (ref 0–200)
Basophils Relative: 2 %
Eosinophils Absolute: 171 cells/uL (ref 15–500)
Eosinophils Relative: 2.8 %
HCT: 43.2 % (ref 35.0–45.0)
Hemoglobin: 15 g/dL (ref 11.7–15.5)
Lymphs Abs: 2123 cells/uL (ref 850–3900)
MCH: 34 pg — ABNORMAL HIGH (ref 27.0–33.0)
MCHC: 34.7 g/dL (ref 32.0–36.0)
MCV: 98 fL (ref 80.0–100.0)
MPV: 9.3 fL (ref 7.5–12.5)
Monocytes Relative: 9.2 %
Neutro Abs: 3123 cells/uL (ref 1500–7800)
Neutrophils Relative %: 51.2 %
Platelets: 300 10*3/uL (ref 140–400)
RBC: 4.41 10*6/uL (ref 3.80–5.10)
RDW: 12.4 % (ref 11.0–15.0)
Total Lymphocyte: 34.8 %
WBC mixed population: 561 cells/uL (ref 200–950)
WBC: 6.1 10*3/uL (ref 3.8–10.8)

## 2017-11-13 LAB — COMPLETE METABOLIC PANEL WITH GFR
AG Ratio: 2.1 (calc) (ref 1.0–2.5)
ALT: 20 U/L (ref 6–29)
AST: 23 U/L (ref 10–35)
Albumin: 4.7 g/dL (ref 3.6–5.1)
Alkaline phosphatase (APISO): 36 U/L (ref 33–130)
BUN: 12 mg/dL (ref 7–25)
CO2: 30 mmol/L (ref 20–32)
Calcium: 10.1 mg/dL (ref 8.6–10.4)
Chloride: 104 mmol/L (ref 98–110)
Creat: 0.73 mg/dL (ref 0.50–0.99)
GFR, Est African American: 102 mL/min/{1.73_m2} (ref >=60)
GFR, Est Non African American: 88 mL/min/{1.73_m2} (ref >=60)
Globulin: 2.2 g/dL (calc) (ref 1.9–3.7)
Glucose, Bld: 81 mg/dL (ref 65–99)
Potassium: 4.3 mmol/L (ref 3.5–5.3)
Sodium: 141 mmol/L (ref 135–146)
Total Bilirubin: 0.7 mg/dL (ref 0.2–1.2)
Total Protein: 6.9 g/dL (ref 6.1–8.1)

## 2017-11-13 LAB — URINALYSIS, ROUTINE W REFLEX MICROSCOPIC
Bacteria, UA: NONE SEEN /HPF
Bilirubin Urine: NEGATIVE
Glucose, UA: NEGATIVE
Hgb urine dipstick: NEGATIVE
Hyaline Cast: NONE SEEN /LPF
Ketones, ur: NEGATIVE
Nitrite: NEGATIVE
Protein, ur: NEGATIVE
RBC / HPF: NONE SEEN /HPF (ref 0–2)
Specific Gravity, Urine: 1.008 (ref 1.001–1.03)
Squamous Epithelial / HPF: NONE SEEN /HPF (ref <=5)
pH: 7.5 (ref 5.0–8.0)

## 2017-11-13 LAB — VITAMIN D 25 HYDROXY (VIT D DEFICIENCY, FRACTURES): Vit D, 25-Hydroxy: 57 ng/mL (ref 30–100)

## 2017-11-13 LAB — HEMOGLOBIN A1C
Hgb A1c MFr Bld: 5.2 % of total Hgb (ref <5.7)
Mean Plasma Glucose: 103 (calc)
eAG (mmol/L): 5.7 (calc)

## 2017-11-13 LAB — LIPID PANEL
Cholesterol: 249 mg/dL — ABNORMAL HIGH (ref <200)
HDL: 101 mg/dL (ref >50)
LDL Cholesterol (Calc): 132 mg/dL (calc) — ABNORMAL HIGH
Non-HDL Cholesterol (Calc): 148 mg/dL (calc) — ABNORMAL HIGH (ref <130)
Total CHOL/HDL Ratio: 2.5 (calc) (ref <5.0)
Triglycerides: 65 mg/dL (ref <150)

## 2017-11-13 LAB — INSULIN, RANDOM: Insulin: 3.2 u[IU]/mL (ref 2.0–19.6)

## 2017-11-13 LAB — MAGNESIUM: Magnesium: 2.2 mg/dL (ref 1.5–2.5)

## 2017-11-13 LAB — TSH: TSH: 1.69 mIU/L (ref 0.40–4.50)

## 2017-11-13 LAB — VITAMIN B12: Vitamin B-12: 681 pg/mL (ref 200–1100)

## 2017-11-25 ENCOUNTER — Encounter: Payer: Self-pay | Admitting: Obstetrics & Gynecology

## 2017-11-25 ENCOUNTER — Ambulatory Visit (INDEPENDENT_AMBULATORY_CARE_PROVIDER_SITE_OTHER): Payer: BLUE CROSS/BLUE SHIELD | Admitting: Obstetrics & Gynecology

## 2017-11-25 ENCOUNTER — Other Ambulatory Visit: Payer: Self-pay

## 2017-11-25 VITALS — BP 108/68 | HR 68 | Resp 16 | Ht 64.0 in | Wt 112.0 lb

## 2017-11-25 DIAGNOSIS — N9089 Other specified noninflammatory disorders of vulva and perineum: Secondary | ICD-10-CM

## 2017-11-25 DIAGNOSIS — Z659 Problem related to unspecified psychosocial circumstances: Secondary | ICD-10-CM

## 2017-11-25 NOTE — Progress Notes (Signed)
GYNECOLOGY  VISIT  CC:   Skin irritation  HPI: 62 y.o. G85P2012 Divorced White or Caucasian female here for skin irritation.  With her visual inspection, there is a particular area that is whitish looking.  She wants me to evaluate this.  Is having a little vaginal discharge.  Denies vaginal bleeding.  Sold her home and is in a town home.  Company moved to [place], [place], so she is driving every day.  Feels like there has been so many changes.  She's feeling very demeaned at work.  Would like suggestion for therapist.  Ed has been supportive.  He is 59 and is retiring in 3 days.  They've looked for a house in [facility] and recently found one.  She thought this would make her happy.  Feels she needs to retire and is planning on doing this in January.    Just had a facial peel and her face is very red and dry today.  GYNECOLOGIC HISTORY: Patient's last menstrual period was 01/01/2005. Contraception: PMP Menopausal hormone therapy: none  Patient Active Problem List   Diagnosis Date Noted  . Osteopenia 11/12/2017  . Melanoma ([place]) 09/25/2017  . Insomnia   . Low bone mass   . Menopausal symptom   . Hyperlipidemia 02/18/2007  . GERD 02/18/2007  . IBS 02/18/2007  . FIBROCYSTIC BREAST DISEASE 02/18/2007  . COLONIC POLYPS, ADENOMATOUS 12/12/2004    Past Medical History:  Diagnosis Date  . Endometrial polyp   . GERD (gastroesophageal reflux disease)   . History of adenomatous polyp of colon    multiple  . History of esophagitis    and duodenitis   12/ 2006  . Hyperlipidemia   . IBS (irritable bowel syndrome)   . Internal bleeding hemorrhoids - Gr 2 05/03/2015   Gr 2 RP LL RA - RP banded 05/03/2015 06/14/2015 RA and LL banded    . Internal bleeding hemorrhoids - Gr 2 05/03/2015   Gr 2 RP LL RA - RP banded 05/03/2015 06/14/2015 RA and LL banded 01/12/2016 RA and RP banded   . osteopenia   . PMB (postmenopausal bleeding)   . 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 Wears glasses      Past Surgical History:  Procedure Laterality Date  . BELPHAROPTOSIS REPAIR Left 2016   upper and lower   . BREAST ENHANCEMENT SURGERY  Oct 2016  . COLONOSCOPY  last one 02-11-2015  . DILATATION & CURETTAGE/HYSTEROSCOPY WITH MYOSURE N/A 03/31/2015   Procedure: DILATATION & CURETTAGE/HYSTEROSCOPY WITH MYOSURE;  Surgeon: Megan Salon, MD;  Location: [facility];  Service: Gynecology;  Laterality: N/A;  endometrial polyp  . DILATION AND CURETTAGE OF UTERUS  1985   w/ suction  . ESOPHAGOGASTRODUODENOSCOPY  12-12-2004  . EUS N/A 03/10/2015   Procedure: LOWER ENDOSCOPIC ULTRASOUND (EUS);  Surgeon: [name], MD;  Location: [facility];  Service: Endoscopy;  Laterality: N/A;  . HEMORRHOID BANDING  2017  . TONSILLECTOMY  as child  . [place]   with Bilateral Tubal Ligation  . WRIST GANGLION EXCISION Left 10-04-2000    MEDS:   Current Outpatient Medications on File Prior to Visit  Medication Sig Dispense Refill  . ALPRAZolam (XANAX) 0.5 MG tablet TAKE 1 TABLET AT BEDTIME 30 tablet 0  . aspirin EC 81 MG tablet Take 81 mg by mouth daily.    . Calcium Citrate (CALCITRATE PO) Take by mouth.    . estradiol-norethindrone ([place])  1-0.5 MG tablet Take 1 tablet by mouth daily. 28 tablet 12  . fish oil-omega-3 fatty acids 1000 MG capsule Take 2 g by mouth daily.    . fluticasone (FLONASE) 50 MCG/ACT nasal spray Place 2 sprays into both nostrils at bedtime. 16 g 1  . hydrocortisone (ANUSOL-HC) 25 MG suppository One suppository vaginally daily. 12 suppository 0  . hyoscyamine (LEVSIN) 0.125 MG tablet Take 1 tablet (0.125 mg total) by mouth every 6 (six) hours as needed for cramping. 60 tablet 2  . loperamide (IMODIUM) 2 MG capsule Take by mouth as needed for diarrhea or loose stools. Reported on 02/02/2015    . Multiple Vitamin (MULTIVITAMIN) capsule Take 1 capsule by mouth 2 (two) times daily. Reported on 02/02/2015    . Omeprazole-Sodium Bicarbonate (ZEGERID  OTC PO) Take 1 capsule by mouth as needed. Reported on 02/02/2015     No current facility-administered medications on file prior to visit.     ALLERGIES: Sulfa antibiotics  Family History  Problem Relation Age of Onset  . Breast cancer Mother   . Hypertension Mother   . Osteoporosis Mother   . COPD Father   . Lung cancer Father   . Colon polyps Father   . Colon cancer Maternal Grandfather   . Colon cancer Maternal Aunt   . Colon cancer Paternal Aunt   . Colon polyps Sister   . Colon polyps Brother     SH:  Divorced,   Review of Systems  Genitourinary:       Vaginal itching   All other systems reviewed and are negative.   PHYSICAL EXAMINATION:    BP 108/68   Pulse 68   Resp 16   Ht 5\' 4"  (1.626 m)   Wt 112 lb (50.8 kg)   LMP 01/01/2005   BMI 19.22 kg/m     General appearance: alert, cooperative and appears stated age Abdomen: soft, non-tender; bowel sounds normal; no masses,  no organomegaly Lymph:  no inguinal LAD noted  Pelvic: External genitalia:  no lesions              Urethra:  normal appearing urethra with no masses, tenderness or lesions              Bartholins and Skenes: normal                 Vagina: normal appearing vagina with normal color, mild discharge noted, no lesions              Cervix: no lesions              Bimanual Exam:  Uterus:  normal size, contour, position, consistency, mobility, non-tender              Adnexa: no mass, fullness, tenderness   Chaperone was present for exam.  Assessment: Vulvar irritation Stressors due to work changes, has retirement plans  Plan: Vaginitis testing obtained today.  Results and recommendations will be called to pt. Names for therapists given to pt.  Feel she would benefit from this.

## 2017-11-26 ENCOUNTER — Other Ambulatory Visit: Payer: Self-pay | Admitting: *Deleted

## 2017-11-26 LAB — VAGINITIS/VAGINOSIS, DNA PROBE
Candida Species: NEGATIVE
Gardnerella vaginalis: POSITIVE — AB
Trichomonas vaginosis: NEGATIVE

## 2017-11-26 MED ORDER — METRONIDAZOLE 0.75 % VA GEL: 1.0000 | Freq: Every day | VAGINAL | 0 refills | Status: AC

## 2017-12-08 ENCOUNTER — Other Ambulatory Visit: Payer: Self-pay | Admitting: Physician Assistant

## 2017-12-08 NOTE — Telephone Encounter (Signed)
Instill 1 to 2 squirts each nares 1 to 2 x /day

## 2017-12-17 ENCOUNTER — Telehealth: Payer: Self-pay | Admitting: Obstetrics & Gynecology

## 2017-12-17 ENCOUNTER — Ambulatory Visit (INDEPENDENT_AMBULATORY_CARE_PROVIDER_SITE_OTHER): Payer: BLUE CROSS/BLUE SHIELD | Admitting: Obstetrics & Gynecology

## 2017-12-17 ENCOUNTER — Encounter: Payer: Self-pay | Admitting: Obstetrics & Gynecology

## 2017-12-17 ENCOUNTER — Other Ambulatory Visit: Payer: Self-pay

## 2017-12-17 VITALS — BP 108/78 | HR 80 | Resp 16 | Ht 64.0 in | Wt 111.0 lb

## 2017-12-17 DIAGNOSIS — L292 Pruritus vulvae: Secondary | ICD-10-CM | POA: Diagnosis not present

## 2017-12-17 DIAGNOSIS — N898 Other specified noninflammatory disorders of vagina: Secondary | ICD-10-CM

## 2017-12-17 MED ORDER — ESTRADIOL 0.1 MG/GM VA CREA: TOPICAL_CREAM | VAGINAL | 1 refills | Status: DC

## 2017-12-17 NOTE — Telephone Encounter (Signed)
Patient has some "irriation and bleeding". Patient would like an appointment tomorrow morning if possible. Patient states she will see any provider.

## 2017-12-17 NOTE — Telephone Encounter (Signed)
Spoke with patient. Patient reports bleeding after intercourse this past weekend. Vaginal itching and burning. Coconut oil for lubricant. Denies vaginal odor or d/c or urinary symptoms. Tx for BV with metrogel on 11/25. Requesting OV with Dr. Sabra Heck or first available provider. Has another appt today 11:30-12:30pm  Advised will review schedule with nursing supervisor and return call.   Call returned to patient, OV scheduled for today at 3:45pm with Dr. Sabra Heck. Patient aware will be worked into schedule. Patient verbalizes understanding and is agreeable.  Encounter closed.

## 2017-12-17 NOTE — Progress Notes (Signed)
GYNECOLOGY  VISIT  CC:   Vaginal itching, spotting, burning with intercourse   HPI: 62 y.o. G35P2012 Divorced White or Caucasian female here for bleeding with intercourse and vaginal itching.  Had vaginitis testing 11/25/17 that showed BV.  She used metrogel and symptoms fully resolved.  She has not been SA until last night.  After intercourse, she is now having some vaginal irritation and vulvar irritation.  She noted a little spotting after intercourse.  Use coconut oil for a lubricant.    Has area on left labia major where biopsy was done in the past.  Very itchy right now.  Does not know what to do to make it better.  GYNECOLOGIC HISTORY: Patient's last menstrual period was 01/01/2005. Contraception: PMP Menopausal hormone therapy: Mimvey  Patient Active Problem List   Diagnosis Date Noted  . Osteopenia 11/12/2017  . Melanoma ([place]) 09/25/2017  . Insomnia   . Low bone mass   . Menopausal symptom   . Hyperlipidemia 02/18/2007  . GERD 02/18/2007  . IBS 02/18/2007  . FIBROCYSTIC BREAST DISEASE 02/18/2007  . COLONIC POLYPS, ADENOMATOUS 12/12/2004    Past Medical History:  Diagnosis Date  . Endometrial polyp   . GERD (gastroesophageal reflux disease)   . History of adenomatous polyp of colon    multiple  . History of esophagitis    and duodenitis   12/ 2006  . Hyperlipidemia   . IBS (irritable bowel syndrome)   . Internal bleeding hemorrhoids - Gr 2 05/03/2015   Gr 2 RP LL RA - RP banded 05/03/2015 06/14/2015 RA and LL banded    . Internal bleeding hemorrhoids - Gr 2 05/03/2015   Gr 2 RP LL RA - RP banded 05/03/2015 06/14/2015 RA and LL banded 01/12/2016 RA and RP banded   . osteopenia   . PMB (postmenopausal bleeding)   . 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 Wears glasses     Past Surgical History:  Procedure Laterality Date  . BELPHAROPTOSIS REPAIR Left 2016   upper and lower   . BREAST ENHANCEMENT SURGERY  Oct 2016  . COLONOSCOPY  last one 02-11-2015  .  DILATATION & CURETTAGE/HYSTEROSCOPY WITH MYOSURE N/A 03/31/2015   Procedure: DILATATION & CURETTAGE/HYSTEROSCOPY WITH MYOSURE;  Surgeon: Megan Salon, MD;  Location: [facility];  Service: Gynecology;  Laterality: N/A;  endometrial polyp  . DILATION AND CURETTAGE OF UTERUS  1985   w/ suction  . ESOPHAGOGASTRODUODENOSCOPY  12-12-2004  . EUS N/A 03/10/2015   Procedure: LOWER ENDOSCOPIC ULTRASOUND (EUS);  Surgeon: [name], MD;  Location: [facility];  Service: Endoscopy;  Laterality: N/A;  . HEMORRHOID BANDING  2017  . TONSILLECTOMY  as child  . [place]   with Bilateral Tubal Ligation  . WRIST GANGLION EXCISION Left 10-04-2000    MEDS:   Current Outpatient Medications on File Prior to Visit  Medication Sig Dispense Refill  . ALPRAZolam (XANAX) 0.5 MG tablet TAKE 1 TABLET AT BEDTIME 30 tablet 0  . Calcium Citrate (CALCITRATE PO) Take by mouth.    . estradiol-norethindrone (MIMVEY) 1-0.5 MG tablet Take 1 tablet by mouth daily. 28 tablet 12  . fish oil-omega-3 fatty acids 1000 MG capsule Take 2 g by mouth daily.    . fluticasone (FLONASE) 50 MCG/ACT nasal spray Place 2 sprays into both nostrils at bedtime. 48 g 3  . hyoscyamine (LEVSIN) 0.125 MG tablet Take 1 tablet (0.125 mg total) by mouth every  6 (six) hours as needed for cramping. 60 tablet 2  . loperamide (IMODIUM) 2 MG capsule Take by mouth as needed for diarrhea or loose stools. Reported on 02/02/2015    . Multiple Vitamin (MULTIVITAMIN) capsule Take 1 capsule by mouth 2 (two) times daily. Reported on 02/02/2015    . Omeprazole-Sodium Bicarbonate (ZEGERID OTC PO) Take 1 capsule by mouth as needed. Reported on 02/02/2015     No current facility-administered medications on file prior to visit.     ALLERGIES: Sulfa antibiotics  Family History  Problem Relation Age of Onset  . Breast cancer Mother   . Hypertension Mother   . Osteoporosis Mother   . COPD Father   . Lung cancer Father   . Colon  polyps Father   . Colon cancer Maternal Grandfather   . Colon cancer Maternal Aunt   . Colon cancer Paternal Aunt   . Colon polyps Sister   . Colon polyps Brother     SH:  Divorced, non smoker  Review of Systems  Genitourinary: Positive for dyspareunia.       Vaginal itching   All other systems reviewed and are negative.   PHYSICAL EXAMINATION:    BP 108/78 (BP Location: Right Arm, Patient Position: Sitting, Cuff Size: Normal)   Pulse 80   Resp 16   Ht 5\' 4"  (1.626 m)   Wt 111 lb (50.3 kg)   LMP 01/01/2005   BMI 19.05 kg/m     General appearance: alert, cooperative and appears stated age Abdomen: soft, non-tender; bowel sounds normal; no masses,  no organomegaly Lymph:  no inguinal LAD noted  Pelvic: External genitalia:  Hypopigmented area on left inferior inner labia major where biopsy was done.  Area c/w scar but pt indicates itching is specifically in this location.              Urethra:  normal appearing urethra with no masses, tenderness or lesions              Bartholins and Skenes: normal                 Vagina: normal appearing vagina with normal color and discharge, no lesions              Cervix: no lesions              Bimanual Exam:  Uterus:  normal size, contour, position, consistency, mobility, non-tender              Adnexa: no mass, fullness, tenderness  Vulvar biopsy recommended.  Procedure:  Area cleansed with Betadine.  Sterile technique used throughout procedure.  Skin anesthestized with Lidocaine 1% plain; 1.45mL.  Lot:  [id number].  Exp: 1/23.  57mm punch biopsy used to obtain specimen.  Biopsy grasped with pick-ups and excised with scissors.  Adequate hemostasis obtained with silver nitrate sticks.  Dressing was not applied.  Pt tolerated procedure well  Chaperone was present for exam.  Assessment: Vaginal itching Specific vulvar itching in area of prior biopsy  Plan: Vaginitis testing obtained today.  Will treat based on results. Vulvar biopsy  obtained today.  Also, additional recommendations will be made pending results.

## 2017-12-18 LAB — VAGINITIS/VAGINOSIS, DNA PROBE
Candida Species: NEGATIVE
Gardnerella vaginalis: NEGATIVE
Trichomonas vaginosis: NEGATIVE

## 2018-02-03 ENCOUNTER — Ambulatory Visit
Admission: RE | Admit: 2018-02-03 | Discharge: 2018-02-03 | Disposition: A | Discharge status: Home / Self Care | Payer: BLUE CROSS/BLUE SHIELD | Source: Ambulatory Visit | Attending: Adult Health Nurse Practitioner | Admitting: Adult Health Nurse Practitioner

## 2018-02-03 DIAGNOSIS — M8580 Other specified disorders of bone density and structure, unspecified site: Secondary | ICD-10-CM

## 2018-02-10 ENCOUNTER — Other Ambulatory Visit: Payer: Self-pay | Admitting: Obstetrics & Gynecology

## 2018-02-10 NOTE — Telephone Encounter (Signed)
Medication refill request: Mimvey Last AEX:  02-12-17 SM  Next AEX: 04-29-18  Last MMG (if hormonal medication request): 27-19 density C/BIRADS 1 negative  Refill authorized: 02-12-17 #28, 12RF.   Medication pended for #84 as patient has aex on 04-29-2018. Please refill if appropriate.

## 2018-04-29 ENCOUNTER — Ambulatory Visit: Payer: BLUE CROSS/BLUE SHIELD | Admitting: Obstetrics & Gynecology

## 2018-09-18 ENCOUNTER — Other Ambulatory Visit: Payer: Self-pay

## 2018-09-22 ENCOUNTER — Ambulatory Visit (INDEPENDENT_AMBULATORY_CARE_PROVIDER_SITE_OTHER): Payer: BC Managed Care – PPO | Admitting: Obstetrics & Gynecology

## 2018-09-22 ENCOUNTER — Encounter: Payer: Self-pay | Admitting: Obstetrics & Gynecology

## 2018-09-22 ENCOUNTER — Other Ambulatory Visit: Payer: Self-pay

## 2018-09-22 VITALS — BP 110/70 | HR 60 | Temp 97.9°F | Ht 64.0 in | Wt 111.0 lb

## 2018-09-22 DIAGNOSIS — Z01419 Encounter for gynecological examination (general) (routine) without abnormal findings: Secondary | ICD-10-CM

## 2018-09-22 MED ORDER — ESTRADIOL-NORETHINDRONE ACET 1-0.5 MG PO TABS: 1.0000 | ORAL_TABLET | Freq: Every day | ORAL | 3 refills | Status: DC

## 2018-09-22 NOTE — Progress Notes (Signed)
63 y.o. [id number] Divorced White or Caucasian female here for annual exam.  Doing well.  Denies vaginal bleeding.  She is now living in [place] [facility] [place] [facility].  They got engaged.  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   Has new PCP next week.  She will ask for help with scheduling MMG.    Patient's last menstrual period was 01/01/2005.          Sexually active: Yes.    The current method of family planning is post menopausal status.    Exercising: Yes.    walking Smoker:  no  Health Maintenance: Pap:  02/12/17 Neg   08/30/14 Neg. HR HPV:neg  History of abnormal Pap:  yes MMG:  02/07/17 BIRADS1:neg  Colonoscopy:  2017 polyps. F/u 5 years  BMD:   02/03/18 osteopenia  TDaP:  2015 Pneumonia vaccine(s):  2005 Shingrix:   Not interested in this right now Hep C testing: 07/15/14 neg  Screening Labs: PCP   reports that she has never smoked. She has never used smokeless tobacco. She reports current alcohol use of about 7.0 - 14.0 standard drinks of alcohol per week. She reports previous drug use. Drug: MDMA (Ecstacy).  Past Medical History:  Diagnosis Date  . Endometrial polyp   . GERD (gastroesophageal reflux disease)   . History of adenomatous polyp of colon    multiple  . History of esophagitis    and duodenitis   12/ 2006  . Hyperlipidemia   . IBS (irritable bowel syndrome)   . Internal bleeding hemorrhoids - Gr 2 05/03/2015   Gr 2 RP LL RA - RP banded 05/03/2015 06/14/2015 RA and LL banded    . Internal bleeding hemorrhoids - Gr 2 05/03/2015   Gr 2 RP LL RA - RP banded 05/03/2015 06/14/2015 RA and LL banded 01/12/2016 RA and RP banded   . osteopenia   . PMB (postmenopausal bleeding)   . 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 Wears glasses     Past Surgical History:  Procedure Laterality Date  . BELPHAROPTOSIS REPAIR Left 2016   upper and lower   . BREAST ENHANCEMENT SURGERY  Oct 2016  . COLONOSCOPY  last one 02-11-2015  . DILATATION  & CURETTAGE/HYSTEROSCOPY WITH MYOSURE N/A 03/31/2015   Procedure: DILATATION & CURETTAGE/HYSTEROSCOPY WITH MYOSURE;  Surgeon: Megan Salon, MD;  Location: [facility];  Service: Gynecology;  Laterality: N/A;  endometrial polyp  . DILATION AND CURETTAGE OF UTERUS  1985   w/ suction  . ESOPHAGOGASTRODUODENOSCOPY  12-12-2004  . EUS N/A 03/10/2015   Procedure: LOWER ENDOSCOPIC ULTRASOUND (EUS);  Surgeon: [name], MD;  Location: [facility];  Service: Endoscopy;  Laterality: N/A;  . HEMORRHOID BANDING  2017  . TONSILLECTOMY  as child  . [place]   with Bilateral Tubal Ligation  . WRIST GANGLION EXCISION Left 10-04-2000    Current Outpatient Medications  Medication Sig Dispense Refill  . ALPRAZolam (XANAX) 0.5 MG tablet TAKE 1 TABLET AT BEDTIME 30 tablet 0  . Calcium Citrate (CALCITRATE PO) Take by mouth.    . fish oil-omega-3 fatty acids 1000 MG capsule Take 2 g by mouth daily.    . fluticasone (FLONASE) 50 MCG/ACT nasal spray Place 2 sprays into both nostrils at bedtime. 48 g 3  . hyoscyamine (LEVSIN) 0.125 MG tablet Take 1 tablet (0.125 mg total) by mouth every 6 (six)  hours as needed for cramping. 60 tablet 2  . loperamide (IMODIUM) 2 MG capsule Take by mouth as needed for diarrhea or loose stools. Reported on 02/02/2015    . MIMVEY 1-0.5 MG tablet TAKE 1 TABLET BY MOUTH EVERY DAY 84 tablet 0  . Multiple Vitamin (MULTIVITAMIN) capsule Take 1 capsule by mouth 2 (two) times daily. Reported on 02/02/2015    . Omeprazole-Sodium Bicarbonate (ZEGERID OTC PO) Take 1 capsule by mouth as needed. Reported on 02/02/2015     No current facility-administered medications for this visit.     Family History  Problem Relation Age of Onset  . Breast cancer Mother   . Hypertension Mother   . Osteoporosis Mother   . COPD Father   . Lung cancer Father   . Colon polyps Father   . Colon cancer Maternal Grandfather   . Colon cancer Maternal Aunt   . Colon cancer  Paternal Aunt   . Colon polyps Sister   . Colon polyps Brother     Review of Systems  All other systems reviewed and are negative.   Exam:   BP 110/70   Pulse 60   Temp 97.9 F (36.6 C) (Temporal)   Ht 5\' 4"  (1.626 m)   Wt 111 lb (50.3 kg)   LMP 01/01/2005   BMI 19.05 kg/m    Height: 5\' 4"  (162.6 cm)  Ht Readings from Last 3 Encounters:  09/22/18 5\' 4"  (1.626 m)  12/17/17 5\' 4"  (1.626 m)  11/25/17 5\' 4"  (1.626 m)    General appearance: alert, cooperative and appears stated age Head: Normocephalic, without obvious abnormality, atraumatic Neck: no adenopathy, supple, symmetrical, trachea midline and thyroid normal to inspection and palpation Lungs: clear to auscultation bilaterally Breasts: normal appearance, no masses or tenderness Heart: regular rate and rhythm Abdomen: soft, non-tender; bowel sounds normal; no masses,  no organomegaly Extremities: extremities normal, atraumatic, no cyanosis or edema Skin: Skin color, texture, turgor normal. No rashes or lesions Lymph nodes: Cervical, supraclavicular, and axillary nodes normal. No abnormal inguinal nodes palpated Neurologic: Grossly normal   Pelvic: External genitalia:  no lesions              Urethra:  normal appearing urethra with no masses, tenderness or lesions              Bartholins and Skenes: normal                 Vagina: normal appearing vagina with normal color and discharge, no lesions              Cervix: no lesions              Pap taken: No. Bimanual Exam:  Uterus:  normal size, contour, position, consistency, mobility, non-tender              Adnexa: normal adnexa and no mass, fullness, tenderness               Rectovaginal: Confirms               Anus:  normal sphincter tone, no lesions  Chaperone was present for exam.  A:  Well Woman with normal exam PMP, on HRT H/o adenomatous colon polyps and h/o carcinoid tumor in a polyp fully resected in 2017.  Repeat in 5 years.  Followed by Carlean Purl.    GERD  P:   Mammogram guidelines reviewed.  Doing 3D.  She is aware this is due.  She will have new  PCP help with appt in [place] area. pap smear with neg HR HPV 8/16.  Neg pap 2019.  Plan pap and HR HPV next year. Lab work will be done next week. Shingrix vaccination discussed with pt RF for Mimvey 1.0/0.5mg  daily.  Taking 1/2 tab daily.  #30/12RF Return annually or prn

## 2018-09-30 DIAGNOSIS — M5416 Radiculopathy, lumbar region: Secondary | ICD-10-CM | POA: Insufficient documentation

## 2018-09-30 DIAGNOSIS — Z85820 Personal history of malignant melanoma of skin: Secondary | ICD-10-CM | POA: Insufficient documentation

## 2018-11-12 ENCOUNTER — Encounter: Payer: Self-pay | Admitting: Obstetrics & Gynecology

## 2018-11-18 ENCOUNTER — Encounter: Payer: BLUE CROSS/BLUE SHIELD | Admitting: Adult Health Nurse Practitioner

## 2018-11-18 ENCOUNTER — Ambulatory Visit: Payer: Self-pay | Admitting: Physician Assistant

## 2019-01-18 ENCOUNTER — Other Ambulatory Visit: Payer: Self-pay | Admitting: Internal Medicine

## 2019-08-05 ENCOUNTER — Telehealth: Payer: Self-pay | Admitting: Internal Medicine

## 2019-08-05 NOTE — Telephone Encounter (Signed)
PJ just wanted to let you know  that  Pt has had hembandings before and wanted to schedule another. Just wanted to let you know.

## 2019-08-05 NOTE — Telephone Encounter (Signed)
I spoke to [facility] and she said she has made an appointment for 09/16/2019 for the banding.

## 2019-09-09 ENCOUNTER — Telehealth: Payer: Self-pay | Admitting: Internal Medicine

## 2019-09-09 NOTE — Telephone Encounter (Signed)
Patient's questions answered and she is doing better so we are cancelling her banding appointment. She will call back with any future problems.

## 2019-09-14 ENCOUNTER — Encounter: Payer: BLUE CROSS/BLUE SHIELD | Admitting: Internal Medicine

## 2019-09-16 ENCOUNTER — Encounter: Payer: BLUE CROSS/BLUE SHIELD | Admitting: Internal Medicine

## 2019-09-19 ENCOUNTER — Other Ambulatory Visit: Payer: Self-pay | Admitting: Obstetrics & Gynecology

## 2019-09-19 DIAGNOSIS — Z01419 Encounter for gynecological examination (general) (routine) without abnormal findings: Secondary | ICD-10-CM

## 2019-09-21 NOTE — Telephone Encounter (Signed)
Medication refill request: mimvey 1-0.5mg   Last AEX:  09/22/18 Next AEX: 12/08/19 Last MMG (if hormonal medication request): 11/12/18 Neg  Refill authorized: 84/0

## 2019-12-08 ENCOUNTER — Ambulatory Visit: Payer: BC Managed Care – PPO | Admitting: Obstetrics & Gynecology

## 2019-12-11 ENCOUNTER — Other Ambulatory Visit: Payer: Self-pay | Admitting: Obstetrics & Gynecology

## 2019-12-11 DIAGNOSIS — Z01419 Encounter for gynecological examination (general) (routine) without abnormal findings: Secondary | ICD-10-CM

## 2019-12-11 NOTE — Telephone Encounter (Signed)
Medication refill request: Mimvey Last AEX:  09/22/18 Dr. Sabra Heck Next AEX: 12/30/19 JJ Last MMG (if hormonal medication request): 11/12/18 BIRADS 1 negative Refill authorized: Today, please advise

## 2019-12-29 NOTE — Progress Notes (Signed)
64 y.o. [id number] Engaged White or Caucasian Not Hispanic or Latino female here for annual exam.  She is on Gibbs for HRT. She takes 1/2 of the tablet a day. She gets hot, but no significant vasomotor symptoms. Willing to decrease the dose.  No vaginal bleeding. No dyspareunia. Same partner x 5 years, live together, engaged.  No bowel or bladder c/o.     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  Followed by Carlean Purl, due for colonoscopy in 2/22.    Patient's last menstrual period was 01/01/2005.          Sexually active: Yes.    The current method of family planning is post menopausal status.    Exercising: Yes.    Walking bike riding Smoker:  no  Health Maintenance: Pap:  02/12/17 Neg              08/30/14 Neg. HR HPV:neg   History of abnormal Pap:  yes MMG:11/12/18 Bi-rads 1 neg Done at [facility]   BMD:   02/03/18 Osteopenia Colonoscopy: February 2017 f/u 5 years  TDaP:  2015  Gardasil: NA   reports that she has never smoked. She has never used smokeless tobacco. She reports current alcohol use of about 7.0 - 14.0 standard drinks of alcohol per week. She reports previous drug use. Drug: MDMA (Ecstacy). 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 2 kids, son near the [place] of [place], daughter in [place]). 4 grandchildren   Past Medical History:  Diagnosis Date  . Endometrial polyp   . GERD (gastroesophageal reflux disease)   . History of adenomatous polyp of colon    multiple  . History of esophagitis    and duodenitis   12/ 2006  . Hyperlipidemia   . IBS (irritable bowel syndrome)   . Internal bleeding hemorrhoids - Gr 2 05/03/2015   Gr 2 RP LL RA - RP banded 05/03/2015 06/14/2015 RA and LL banded    . Internal bleeding hemorrhoids - Gr 2 05/03/2015   Gr 2 RP LL RA - RP banded 05/03/2015 06/14/2015 RA and LL banded 01/12/2016 RA and RP banded   . osteopenia   . PMB (postmenopausal bleeding)   . Rectal carcinoid tumor    removed EUS   03-10-2015  . Wears glasses     Past Surgical History:  Procedure Laterality Date  . BELPHAROPTOSIS REPAIR Left 2016   upper and lower   . BREAST ENHANCEMENT SURGERY  Oct 2016  . COLONOSCOPY  last one 02-11-2015  . DILATATION & CURETTAGE/HYSTEROSCOPY WITH MYOSURE N/A 03/31/2015   Procedure: DILATATION & CURETTAGE/HYSTEROSCOPY WITH MYOSURE;  Surgeon: Megan Salon, MD;  [facility];  Service: Gynecology;  Laterality: N/A;  endometrial polyp  . DILATION AND CURETTAGE OF UTERUS  1985   w/ suction  . ESOPHAGOGASTRODUODENOSCOPY  12-12-2004  . EUS N/A 03/10/2015   Procedure: LOWER ENDOSCOPIC ULTRASOUND (EUS);  Surgeon: [name], MD;  Location: [facility];  Service: Endoscopy;  Laterality: N/A;  . HEMORRHOID BANDING  2017  . TONSILLECTOMY  as child  . [place]   with Bilateral Tubal Ligation  . WRIST GANGLION EXCISION Left 10-04-2000    Current Outpatient Medications  Medication Sig Dispense Refill  . ALPRAZolam (XANAX) 0.5 MG tablet TAKE 1 TABLET AT BEDTIME 30 tablet 0  . Calcium Citrate (CALCITRATE PO) Take by mouth.    . fish oil-omega-3 fatty acids 1000  MG capsule Take 2 g by mouth daily.    . fluticasone (FLONASE) 50 MCG/ACT nasal spray Use 1 to sprays to each Nostril at Bedtime for Allergies 48 g 3  . hyoscyamine (LEVSIN) 0.125 MG tablet Take 1 tablet (0.125 mg total) by mouth every 6 (six) hours as needed for cramping. 60 tablet 2  . loperamide (IMODIUM) 2 MG capsule Take by mouth as needed for diarrhea or loose stools. Reported on 02/02/2015    . MIMVEY 1-0.5 MG tablet TAKE 1 TABLET BY MOUTH EVERY DAY 28 tablet 0  . Multiple Vitamin (MULTIVITAMIN) capsule Take 1 capsule by mouth 2 (two) times daily. Reported on 02/02/2015     No current facility-administered medications for this visit.    Family History  Problem Relation Age of Onset  . Breast cancer Mother   . Hypertension Mother   . Osteoporosis Mother   . COPD Father   .  Lung cancer Father   . Colon polyps Father   . Colon cancer Maternal Grandfather   . Colon cancer Maternal Aunt   . Colon cancer Paternal Aunt   . Colon polyps Sister   . Colon polyps Brother     Review of Systems  All other systems reviewed and are negative.   Exam:   BP 122/64   Pulse 88   Ht 5\' 4"  (1.626 m)   Wt 112 lb 12.8 oz (51.2 kg)   LMP 01/01/2005   SpO2 98%   BMI 19.36 kg/m   Weight change: @WEIGHTCHANGE @ Height:   Height: 5\' 4"  (162.6 cm)  Ht Readings from Last 3 Encounters:  12/30/19 5\' 4"  (1.626 m)  09/22/18 5\' 4"  (1.626 m)  12/17/17 5\' 4"  (1.626 m)    General appearance: alert, cooperative and appears stated age Head: Normocephalic, without obvious abnormality, atraumatic Neck: no adenopathy, supple, symmetrical, trachea midline and thyroid normal to inspection and palpation Lungs: clear to auscultation bilaterally Cardiovascular: regular rate and rhythm Breasts: normal appearance, no masses or tenderness Abdomen: soft, non-tender; non distended,  no masses,  no organomegaly Extremities: extremities normal, atraumatic, no cyanosis or edema Skin: Skin color, texture, turgor normal. No rashes or lesions Lymph nodes: Cervical, supraclavicular, and axillary nodes normal. No abnormal inguinal nodes palpated Neurologic: Grossly normal   Pelvic: External genitalia:  no lesions              Urethra:  normal appearing urethra with no masses, tenderness or lesions              Bartholins and Skenes: normal                 Vagina: normal appearing vagina with normal color and discharge, no lesions              Cervix: no lesions               Bimanual Exam:  Uterus:  normal size, contour, position, consistency, mobility, non-tender              Adnexa: no mass, fullness, tenderness               Rectovaginal: Confirms               Anus:  normal sphincter tone, no lesions  01/01/20 chaperoned for the exam.  A:  Well Woman with normal exam  On HRT, will  decrease her dose, she may try to go off    P:   Pap with hpv  Mammogram due, she will schedule  Colonoscopy due in 2/22  She will do her labs with her primary  Discussed breast self exam  Discussed calcium and vit D intake  Will decrease her HRT, discussed risks.

## 2019-12-30 ENCOUNTER — Other Ambulatory Visit: Payer: Self-pay

## 2019-12-30 ENCOUNTER — Ambulatory Visit: Payer: BC Managed Care – PPO | Admitting: Obstetrics and Gynecology

## 2019-12-30 ENCOUNTER — Encounter: Payer: Self-pay | Admitting: Obstetrics and Gynecology

## 2019-12-30 VITALS — BP 122/64 | HR 88 | Ht 64.0 in | Wt 112.8 lb

## 2019-12-30 DIAGNOSIS — Z79890 Hormone replacement therapy (postmenopausal): Secondary | ICD-10-CM | POA: Diagnosis not present

## 2019-12-30 DIAGNOSIS — Z124 Encounter for screening for malignant neoplasm of cervix: Secondary | ICD-10-CM

## 2019-12-30 DIAGNOSIS — Z01419 Encounter for gynecological examination (general) (routine) without abnormal findings: Secondary | ICD-10-CM | POA: Diagnosis not present

## 2019-12-30 MED ORDER — ESTRADIOL-NORETHINDRONE ACET 0.5-0.1 MG PO TABS: 1.0000 | ORAL_TABLET | Freq: Every day | ORAL | 3 refills | Status: DC

## 2019-12-30 NOTE — Patient Instructions (Addendum)
Menopause and Hormone Replacement Therapy Menopause is a normal time of life when menstrual periods stop completely and the ovaries stop producing the female hormones estrogen and progesterone. This lack of hormones can affect your health and cause undesirable symptoms. Hormone replacement therapy (HRT) can relieve some of those symptoms. What is hormone replacement therapy? HRT is the use of artificial (synthetic) hormones to replace hormones that your body has stopped producing because you have reached menopause. What are my options for HRT?  HRT may consist of the synthetic hormones estrogen and progestin, or it may consist of only estrogen (estrogen-only therapy). You and your health care provider will decide which form of HRT is best for you. If you choose to be on HRT and you have a uterus, estrogen and progestin are usually prescribed. Estrogen-only therapy is used for women who do not have a uterus. Possible options for taking HRT include:  Pills.  Patches.  Gels.  Sprays.  Vaginal cream.  Vaginal rings.  Vaginal inserts. The amount of hormone(s) that you take and how long you take the hormone(s) varies according to your health. It is important to:  Begin HRT with the lowest possible dosage.  Stop HRT as soon as your health care provider tells you to stop.  Work with your health care provider so that you feel informed and comfortable with your decisions. What are the benefits of HRT? HRT can reduce the frequency and severity of menopausal symptoms. Benefits of HRT vary according to the kind of symptoms that you have, how severe they are, and your overall health. HRT may help to improve the following symptoms of menopause:  Hot flashes and night sweats. These are sudden feelings of heat that spread over the face and body. The skin may turn red, like a blush. Night sweats are hot flashes that happen while you are sleeping or trying to sleep.  Bone loss (osteoporosis). The  body loses calcium more quickly after menopause, causing the bones to become weaker. This can increase the risk for bone breaks (fractures).  Vaginal dryness. The lining of the vagina can become thin and dry, which can cause pain during sex or cause infection, burning, or itching.  Urinary tract infections.  Urinary incontinence. This is the inability to control when you pass urine.  Irritability.  Short-term memory problems. What are the risks of HRT? Risks of HRT vary depending on your individual health and medical history. Risks of HRT also depend on whether you receive both estrogen and progestin or you receive estrogen only. HRT may increase the risk of:  Spotting. This is when a small amount of blood leaks from the vagina unexpectedly.  Endometrial cancer. This cancer is in the lining of the uterus (endometrium).  Breast cancer.  Increased density of breast tissue. This can make it harder to find breast cancer on a breast X-ray (mammogram).  Stroke.  Heart disease.  Blood clots.  Gallbladder disease.  Liver disease. Risks of HRT can increase if you have any of the following conditions:  Endometrial cancer.  Liver disease.  Heart disease.  Breast cancer.  History of blood clots.  History of stroke. Follow these instructions at home:  Take over-the-counter and prescription medicines only as told by your health care provider.  Get mammograms, pelvic exams, and medical checkups as often as told by your health care provider.  Have Pap tests done as often as told by your health care provider. A Pap test is sometimes called a Pap smear. It   is a screening test that is used to check for signs of cancer of the cervix and vagina. A Pap test can also identify the presence of infection or precancerous changes. Pap tests may be done: ? Every 3 years, starting at age 43. ? Every 5 years, starting after age 27, in combination with testing for human papillomavirus  (HPV). ? More often or less often depending on other medical conditions you have, your age, and other risk factors.  It is up to you to get the results of your Pap test. Ask your health care provider, or the department that is doing the test, when your results will be ready.  Keep all follow-up visits as told by your health care provider. This is important. Contact a health care provider if you have:  Pain or swelling in your legs.  Shortness of breath.  Chest pain.  Lumps or changes in your breasts or armpits.  Slurred speech.  Pain, burning, or bleeding when you urinate.  Unusual vaginal bleeding.  Dizziness or headaches.  Weakness or numbness in any part of your arms or legs.  Pain in your abdomen. Summary  Menopause is a normal time of life when menstrual periods stop completely and the ovaries stop producing the female hormones estrogen and progesterone.  Hormone replacement therapy (HRT) can relieve some of the symptoms of menopause.  HRT can reduce the frequency and severity of menopausal symptoms.  Risks of HRT vary depending on your individual health and medical history. This information is not intended to replace advice given to you by your health care provider. Make sure you discuss any questions you have with your health care provider. Document Revised: 08/20/2017 Document Reviewed: 08/20/2017 Elsevier Patient Education  [place]   We recommended that you start or continue a regular exercise program for good health. Physical activity is anything that gets your body moving, some is better than none. The CDC recommends 150 minutes per week of Moderate-Intensity Aerobic Activity and 2 or more days of Muscle Strengthening Activity.  Benefits of exercise are limitless: helps weight loss/weight maintenance, improves mood and energy, helps with depression and anxiety, improves sleep, tones and strengthens muscles, improves balance, improves bone  density, protects from chronic conditions such as heart disease, high blood pressure and diabetes and so much more. To learn more visit: WhyNotPoker.uy  DIET: Good nutrition starts with a healthy diet of fruits, vegetables, whole grains, and lean protein sources. Drink plenty of water for hydration. Minimize empty calories, sodium, sweets. For more information about dietary recommendations visit: GeekRegister.com.ee and http://schaefer-mitchell.com/  ALCOHOL:  Women should limit their alcohol intake to no more than 7 drinks/beers/glasses of wine (combined, not each!) per week. Moderation of alcohol intake to this level decreases your risk of breast cancer and liver damage.  If you are concerned that you may have a problem, or your friends have told you they are concerned about your drinking, there are many resources to help. A well-known program that is free, effective, and available to all people all over the nation is Alcoholics Anonymous.  Check out this site to learn more: BlockTaxes.se   CALCIUM AND VITAMIN D:  Adequate intake of calcium and Vitamin D are recommended for bone health.  You should be getting between 1000-1200 mg of calcium and 800 units of Vitamin D daily between diet and supplements  PAP SMEARS:  Pap smears, to check for cervical cancer or precancers,  have traditionally been done yearly, scientific advances have shown  that most women can have pap smears less often.  However, every woman still should have a physical exam from her gynecologist every year. It will include a breast check, inspection of the vulva and vagina to check for abnormal growths or skin changes, a visual exam of the cervix, and then an exam to evaluate the size and shape of the uterus and ovaries. We will also provide age appropriate advice regarding health maintenance, like when you should have certain vaccines, screening for  sexually transmitted diseases, bone density testing, colonoscopy, mammograms, etc.   MAMMOGRAMS:  All women over 79 years old should have a routine mammogram.   COLON CANCER SCREENING: Now recommend starting at age 82. At this time colonoscopy is not covered for routine screening until 50. There are take home tests that can be done between 45-49.   COLONOSCOPY:  Colonoscopy to screen for colon cancer is recommended for all women at age 61.  We know, you hate the idea of the prep.  We agree, BUT, having colon cancer and not knowing it is worse!!  Colon cancer so often starts as a polyp that can be seen and removed at colonscopy, which can quite literally save your life!  And if your first colonoscopy is normal and you have no family history of colon cancer, most women don't have to have it again for 10 years.  Once every ten years, you can do something that may end up saving your life, right?  We will be happy to help you get it scheduled when you are ready.  Be sure to check your insurance coverage so you understand how much it will cost.  It may be covered as a preventative service at no cost, but you should check your particular policy.      Breast Self-Awareness Breast self-awareness means being familiar with how your breasts look and feel. It involves checking your breasts regularly and reporting any changes to your health care provider. Practicing breast self-awareness is important. A change in your breasts can be a sign of a serious medical problem. Being familiar with how your breasts look and feel allows you to find any problems early, when treatment is more likely to be successful. All women should practice breast self-awareness, including women who have had breast implants. How to do a breast self-exam One way to learn what is normal for your breasts and whether your breasts are changing is to do a breast self-exam. To do a breast self-exam: Look for Changes  1. Remove all the clothing  above your waist. 2. Stand in front of a mirror in a room with good lighting. 3. Put your hands on your hips. 4. Push your hands firmly downward. 5. Compare your breasts in the mirror. Look for differences between them (asymmetry), such as: ? Differences in shape. ? Differences in size. ? Puckers, dips, and bumps in one breast and not the other. 6. Look at each breast for changes in your skin, such as: ? Redness. ? Scaly areas. 7. Look for changes in your nipples, such as: ? Discharge. ? Bleeding. ? Dimpling. ? Redness. ? A change in position. Feel for Changes Carefully feel your breasts for lumps and changes. It is best to do this while lying on your back on the floor and again while sitting or standing in the shower or tub with soapy water on your skin. Feel each breast in the following way:  Place the arm on the side of the breast you are  examining above your head.  Feel your breast with the other hand.  Start in the nipple area and make  inch (2 cm) overlapping circles to feel your breast. Use the pads of your three middle fingers to do this. Apply light pressure, then medium pressure, then firm pressure. The light pressure will allow you to feel the tissue closest to the skin. The medium pressure will allow you to feel the tissue that is a little deeper. The firm pressure will allow you to feel the tissue close to the ribs.  Continue the overlapping circles, moving downward over the breast until you feel your ribs below your breast.  Move one finger-width toward the center of the body. Continue to use the  inch (2 cm) overlapping circles to feel your breast as you move slowly up toward your collarbone.  Continue the up and down exam using all three pressures until you reach your armpit.  Write Down What You Find  Write down what is normal for each breast and any changes that you find. Keep a written record with breast changes or normal findings for each breast. By writing  this information down, you do not need to depend only on memory for size, tenderness, or location. Write down where you are in your menstrual cycle, if you are still menstruating. If you are having trouble noticing differences in your breasts, do not get discouraged. With time you will become more familiar with the variations in your breasts and more comfortable with the exam. How often should I examine my breasts? Examine your breasts every month. If you are breastfeeding, the best time to examine your breasts is after a feeding or after using a breast pump. If you menstruate, the best time to examine your breasts is 5-7 days after your period is over. During your period, your breasts are lumpier, and it may be more difficult to notice changes. When should I see my health care provider? See your health care provider if you notice:  A change in shape or size of your breasts or nipples.  A change in the skin of your breast or nipples, such as a reddened or scaly area.  Unusual discharge from your nipples.  A lump or thick area that was not there before.  Pain in your breasts.  Anything that concerns you.

## 2019-12-31 ENCOUNTER — Encounter: Payer: Self-pay | Admitting: Obstetrics and Gynecology

## 2019-12-31 ENCOUNTER — Encounter: Payer: Self-pay | Admitting: Internal Medicine

## 2020-01-07 ENCOUNTER — Other Ambulatory Visit (HOSPITAL_COMMUNITY)
Admission: RE | Admit: 2020-01-07 | Discharge: 2020-01-07 | Disposition: A | Discharge status: Home / Self Care | Payer: BC Managed Care – PPO | Source: Ambulatory Visit | Attending: Obstetrics and Gynecology | Admitting: Obstetrics and Gynecology

## 2020-01-07 DIAGNOSIS — Z124 Encounter for screening for malignant neoplasm of cervix: Secondary | ICD-10-CM | POA: Insufficient documentation

## 2020-01-07 NOTE — Addendum Note (Signed)
Addended by: Tobi Bastos on: 01/07/2020 08:19 AM   Modules accepted: Orders

## 2020-01-13 LAB — CYTOLOGY - PAP
Comment: NEGATIVE
Diagnosis: NEGATIVE
High risk HPV: NEGATIVE

## 2020-01-19 ENCOUNTER — Encounter: Payer: Self-pay | Admitting: Obstetrics and Gynecology

## 2020-01-27 NOTE — Telephone Encounter (Signed)
Spoke with patient. Reviewed concerns. Advised social history has been updated.  Reviewed process of [facility] amendment/correction of health information process. Questions answered. Patient thankful for call. She is aware to contact the office if any additional assistance is needed.   Routing to provider for final review. Patient is agreeable to disposition. Will close encounter.

## 2020-01-28 ENCOUNTER — Ambulatory Visit (AMBULATORY_SURGERY_CENTER): Payer: Self-pay

## 2020-01-28 ENCOUNTER — Other Ambulatory Visit: Payer: Self-pay

## 2020-01-28 VITALS — Ht 64.0 in | Wt 111.0 lb

## 2020-01-28 DIAGNOSIS — Z86010 Personal history of colonic polyps: Secondary | ICD-10-CM

## 2020-01-28 NOTE — Progress Notes (Signed)
No egg or soy allergy known to patient  No issues with past sedation with any surgeries or procedures No intubation problems in the past  No FH of Malignant Hyperthermia No diet pills per patient No home 02 use per patient  No blood thinners per patient  Pt denies issues with constipation  No A fib or A flutter  EMMI video via [place] 19 guidelines implemented in PV today with Pt and RN  Pt is fully vaccinated  for Covid x 2; NO PA's for preps discussed with pt in PV today  Due to the COVID-19 pandemic we are asking patients to follow certain guidelines.  Pt aware of COVID protocols and [facility] guidelines

## 2020-01-29 NOTE — Telephone Encounter (Signed)
Valerie Hubbard sound like a conversation you may spoke with her about getting her records.

## 2020-02-15 NOTE — Telephone Encounter (Signed)
Last read by Aretha Parrot at 11:28 AM on 02/09/2020.  Encounter closed.

## 2020-02-18 ENCOUNTER — Encounter: Payer: Self-pay | Admitting: Internal Medicine

## 2020-02-18 ENCOUNTER — Ambulatory Visit (AMBULATORY_SURGERY_CENTER): Payer: BC Managed Care – PPO | Admitting: Internal Medicine

## 2020-02-18 VITALS — BP 101/64 | HR 64 | Temp 98.0°F | Resp 9 | Ht 64.0 in | Wt 111.0 lb

## 2020-02-18 DIAGNOSIS — Z86010 Personal history of colonic polyps: Secondary | ICD-10-CM | POA: Diagnosis present

## 2020-02-18 MED ORDER — SODIUM CHLORIDE 0.9 % IV SOLN: 500.0000 mL | Freq: Once | INTRAVENOUS | Status: DC

## 2020-02-18 NOTE — Progress Notes (Signed)
Report to PACU, RN, vss, BBS= Clear.  

## 2020-02-18 NOTE — Op Note (Signed)
[name]: Valerie Hubbard Procedure Date: 02/18/2020 2:02 PM MRN: [id number] Endoscopist: Gatha Mayer , MD Age: 65 Referring MD:  Date of Birth: 1955-09-08 Gender: Female Account #: [id number] Procedure:                Colonoscopy Indications:              Surveillance: Personal history of adenomatous                            polyps on last colonoscopy 5 years ago Medicines:                Propofol per Anesthesia, Monitored Anesthesia Care Procedure:                Pre-Anesthesia Assessment:                           - Prior to the procedure, a History and Physical                            was performed, and patient medications and                            allergies were reviewed. The patient's tolerance of                            previous anesthesia was also reviewed. The risks                            and benefits of the procedure and the sedation                            options and risks were discussed with the patient.                            All questions were answered, and informed consent                            was obtained. Prior Anticoagulants: The patient has                            taken no previous anticoagulant or antiplatelet                            agents. ASA Grade Assessment: II - A patient with                            mild systemic disease. After reviewing the risks                            and benefits, the patient was deemed in                            satisfactory condition to undergo the procedure.  After obtaining informed consent, the colonoscope                            was passed under direct vision. Throughout the                            procedure, the patient's blood pressure, pulse, and                            oxygen saturations were monitored continuously. The                            Olympus [id number]) [id number] was introduced                             through the anus and advanced to the the cecum,                            identified by appendiceal orifice and ileocecal                            valve. The colonoscopy was performed without                            difficulty. The patient tolerated the procedure                            well. The quality of the bowel preparation was                            good. The ileocecal valve, appendiceal orifice, and                            rectum were photographed. The bowel preparation                            used was Miralax via split dose instruction. Scope In: 2:19:28 PM Scope Out: 2:31:08 PM Scope Withdrawal Time: 0 hours 7 minutes 31 seconds  Total Procedure Duration: 0 hours 11 minutes 40 seconds  Findings:                 The perianal and digital rectal examinations were                            normal.                           Multiple diverticula were found in the sigmoid                            colon.                           External and internal hemorrhoids were found.  The exam was otherwise without abnormality on                            direct and retroflexion views. Complications:            No immediate complications. Estimated Blood Loss:     Estimated blood loss: none. Impression:               - Diverticulosis in the sigmoid colon.                           - External and internal hemorrhoids. And scars from                            prior banding (in rectum)                           - The examination was otherwise normal on direct                            and retroflexion views.                           - No specimens collected.                           - Personal history of colonic polypshx adenomas and                            FHx polyps and colon cancer (distant relatives)+ hx                            rectal carcinoma. Recommendation:           - Patient has a contact number available for                             emergencies. The signs and symptoms of potential                            delayed complications were discussed with the                            patient. Return to normal activities tomorrow.                            Written discharge instructions were provided to the                            patient.                           - Resume previous diet.                           - Continue present medications.                           -  Repeat colonoscopy in 5 years for surveillance. Gatha Mayer, MD 02/18/2020 2:38:53 PM This report has been signed electronically.

## 2020-02-18 NOTE — Patient Instructions (Signed)
YOU HAD AN ENDOSCOPIC PROCEDURE TODAY AT THE [facility]:   Refer to the procedure report that was given to you for any specific questions about what was found during the examination.  If the procedure report does not answer your questions, please call your gastroenterologist to clarify.  If you requested that your care partner not be given the details of your procedure findings, then the procedure report has been included in a sealed envelope for you to review at your convenience later. ° °YOU SHOULD EXPECT: Some feelings of bloating in the abdomen. Passage of more gas than usual.  Walking can help get rid of the air that was put into your GI tract during the procedure and reduce the bloating. If you had a lower endoscopy (such as a colonoscopy or flexible sigmoidoscopy) you may notice spotting of blood in your stool or on the toilet paper. If you underwent a bowel prep for your procedure, you may not have a normal bowel movement for a few days. ° °Please Note:  You might notice some irritation and congestion in your nose or some drainage.  This is from the oxygen used during your procedure.  There is no need for concern and it should clear up in a day or so. ° °SYMPTOMS TO REPORT IMMEDIATELY: ° °Following lower endoscopy (colonoscopy or flexible sigmoidoscopy): ° Excessive amounts of blood in the stool ° Significant tenderness or worsening of abdominal pains ° Swelling of the abdomen that is new, acute ° Fever of 100°F or higher ° °For urgent or emergent issues, a gastroenterologist can be reached at any hour by calling [phone]. °Do not use MyChart messaging for urgent concerns.  ° ° °DIET:  We do recommend a small meal at first, but then you may proceed to your regular diet.  Drink plenty of fluids but you should avoid alcoholic beverages for 24 hours. ° °ACTIVITY:  You should plan to take it easy for the rest of today and you should NOT DRIVE or use heavy machinery until tomorrow (because of  the sedation medicines used during the test).   ° °FOLLOW UP: °Our staff will call the number listed on your records 48-72 hours following your procedure to check on you and address any questions or concerns that you may have regarding the information given to you following your procedure. If we do not reach you, we will leave a message.  We will attempt to reach you two times.  During this call, we will ask if you have developed any symptoms of COVID 19. If you develop any symptoms (ie: fever, flu-like symptoms, shortness of breath, cough etc.) before then, please call [phone].  If you test positive for Covid 19 in the 2 weeks post procedure, please call and report this information to us.   ° °SIGNATURES/CONFIDENTIALITY: °You and/or your care partner have signed paperwork which will be entered into your electronic medical record.  These signatures attest to the fact that that the information above on your After Visit Summary has been reviewed and is understood.  Full responsibility of the confidentiality of this discharge information lies with you and/or your care-partner.  °

## 2020-02-19 ENCOUNTER — Other Ambulatory Visit: Payer: Self-pay | Admitting: Internal Medicine

## 2020-02-22 ENCOUNTER — Telehealth: Payer: Self-pay

## 2020-02-22 ENCOUNTER — Telehealth: Payer: Self-pay | Admitting: *Deleted

## 2020-02-22 NOTE — Telephone Encounter (Signed)
  Follow up Call-  Call back number 02/18/2020  Post procedure Call Back phone  # [phone]  Permission to leave phone message Yes  Some recent data might be hidden     Patient questions:  Do you have a fever, pain , or abdominal swelling? No. Pain Score  0 *  Have you tolerated food without any problems? Yes.    Have you been able to return to your normal activities? Yes.    Do you have any questions about your discharge instructions: Diet   No. Medications  No. Follow up visit  No.  Do you have questions or concerns about your Care? No.  Actions: * If pain score is 4 or above: No action needed, pain <4.  1. Have you developed a fever since your procedure? no  2.   Have you had an respiratory symptoms (SOB or cough) since your procedure? no  3.   Have you tested positive for COVID 19 since your procedure no  4.   Have you had any family members/close contacts diagnosed with the COVID 19 since your procedure?  no   If yes to any of these questions please route to Joylene John, RN and Joella Prince, RN

## 2020-02-22 NOTE — Telephone Encounter (Signed)
Left message on follow up call. 

## 2020-11-30 ENCOUNTER — Telehealth: Payer: Self-pay | Admitting: Internal Medicine

## 2020-11-30 NOTE — Telephone Encounter (Signed)
Inbound call from patient states she is having on and off rectal bleeding with most recent episode happening over Thanksgiving. Patient is requesting hemorrhoid banding. Best contact number [phone]

## 2020-11-30 NOTE — Telephone Encounter (Signed)
Left message for pt to call back  °

## 2020-12-01 NOTE — Telephone Encounter (Signed)
Left message for pt to call back  °

## 2020-12-02 ENCOUNTER — Ambulatory Visit: Payer: BC Managed Care – PPO | Admitting: Internal Medicine

## 2020-12-02 ENCOUNTER — Encounter: Payer: Self-pay | Admitting: Internal Medicine

## 2020-12-02 VITALS — BP 108/68 | HR 67 | Ht 64.0 in | Wt 108.0 lb

## 2020-12-02 DIAGNOSIS — K641 Second degree hemorrhoids: Secondary | ICD-10-CM | POA: Diagnosis not present

## 2020-12-02 DIAGNOSIS — K580 Irritable bowel syndrome with diarrhea: Secondary | ICD-10-CM

## 2020-12-02 DIAGNOSIS — K594 Anal spasm: Secondary | ICD-10-CM | POA: Diagnosis not present

## 2020-12-02 DIAGNOSIS — K648 Other hemorrhoids: Secondary | ICD-10-CM | POA: Diagnosis not present

## 2020-12-02 NOTE — Progress Notes (Signed)
Valerie Hubbard 65 y.o. 08-07-1955 [id number]  Assessment & Plan:   Encounter Diagnoses  Name Primary?   Prolapsed internal hemorrhoids, grade 2 Yes   Irritable bowel syndrome with diarrhea    Proctalgia fugax      Right posterior internal hemorrhoid banded.  Follow-up as needed.  Subjective:   Chief Complaint: Rectal bleeding history of hemorrhoids  HPI 65 year old white woman with a history of IBS-D, bleeding internal hemorrhoids status post banding, and history of colon polyps.  Also has a history of history of rectal carcinoid tumor.  Recently had recurrent rectal bleeding.  Stools remain loose.  Has symptoms consistent with proctalgia fugax, not a new problem. February 2022 colonoscopy given history of polyps, family history of colon cancer and polyps-had diverticulosis and internal and external hemorrhoids and banding scar seen no polyps this time    Multiple previous hemorrhoidal banding's the last of which was in January 2018  Review of systems also positive for a syncopal episode and head trauma/laceration seen in the ED in September.  She had leg cramps and got up to massage her legs and passed out and struck her head and had a laceration sutured and negative CT of the head  Allergies  Allergen Reactions   Sulfa Antibiotics Other (See Comments)    Unknown childhood reaction   Current Meds  Medication Sig   ALPRAZolam (XANAX) 0.5 MG tablet TAKE 1 TABLET AT BEDTIME   Calcium Citrate (CALCITRATE PO) Take by mouth.   Estradiol-Norethindrone Acet (MIMVEY LO) 0.5-0.1 MG tablet Take 1 tablet by mouth daily. (Patient taking differently: Take 0.5 tablets by mouth daily.)   fish oil-omega-3 fatty acids 1000 MG capsule Take 2 g by mouth daily.   fluticasone (FLONASE) 50 MCG/ACT nasal spray USE 1 TO SPRAYS TO EACH NOSTRIL AT BEDTIME FOR ALLERGIES   hyoscyamine (LEVSIN) 0.125 MG tablet Take 1 tablet (0.125 mg total) by mouth every 6 (six) hours as needed for cramping.    loperamide (IMODIUM) 2 MG capsule Take by mouth as needed for diarrhea or loose stools. Reported on 02/02/2015   Multiple Vitamin (MULTIVITAMIN) capsule Take 1 capsule by mouth 2 (two) times daily. Reported on 02/02/2015   Past Medical History:  Diagnosis Date   Endometrial polyp    GERD (gastroesophageal reflux disease)    hx of   History of adenomatous polyp of colon    multiple   History of esophagitis    and duodenitis   12/ 2006   Hyperlipidemia    on meds   IBS (irritable bowel syndrome)    Internal bleeding hemorrhoids - Gr 2 05/03/2015   Gr 2 RP LL RA - RP banded 05/03/2015 06/14/2015 RA and LL banded     Internal bleeding hemorrhoids - Gr 2 05/03/2015   Gr 2 RP LL RA - RP banded 05/03/2015 06/14/2015 RA and LL banded 01/12/2016 RA and RP banded    osteopenia    PMB (postmenopausal bleeding)    Rectal carcinoid tumor    removed EUS  03-10-2015   Wears glasses    Past Surgical History:  Procedure Laterality Date   BELPHAROPTOSIS REPAIR Left 2016   upper and lower    BREAST ENHANCEMENT SURGERY  Oct 2016   COLONOSCOPY  2017   CG-MAC-mira (Exc)-int hems/rectal mass   DILATATION & CURETTAGE/HYSTEROSCOPY WITH MYOSURE N/A 03/31/2015   Procedure: DILATATION & CURETTAGE/HYSTEROSCOPY WITH MYOSURE;  Surgeon: Megan Salon, MD;  Location: [facility];  Service: Gynecology;  Laterality: N/A;  endometrial polyp   DILATION AND CURETTAGE OF UTERUS  1985   w/ suction   ESOPHAGOGASTRODUODENOSCOPY  12-12-2004   EUS N/A 03/10/2015   Procedure: LOWER ENDOSCOPIC ULTRASOUND (EUS);  Surgeon: [name], MD;  Location: [facility];  Service: Endoscopy;  Laterality: N/A;   HEMORRHOID BANDING  2017   TONSILLECTOMY  as child   UMBILICAL HERNIA REPAIR  1995   with Bilateral Tubal Ligation   WRIST GANGLION EXCISION Left 10-04-2000   Social History   Social History Narrative   Divorced with significant other   Now living in [place]   family history includes Breast cancer  in her mother; COPD in her father; Colon cancer in her maternal aunt, maternal grandfather, and paternal aunt; Colon polyps in her brother, father, and sister; Hypertension in her mother; Lung cancer in her father; Osteoporosis in her mother.   Review of Systems As above  Objective:   Physical Exam BP 108/68   Pulse 67   Ht _0  (1.626 m)   Wt 108 lb (49 kg)   LMP 01/01/2005   BMI 18.54 kg/m   Patti Martinique, CMA present.  Rectal exam reveals a small fleshy right anterior external hemorrhoid and digital exam is otherwise normal.  Anoscopy demonstrates a grade 2 right posterior internal hemorrhoid complex.  Grade 1 right anterior and left lateral.  PROCEDURE NOTE: The patient presents with symptomatic grade 2  hemorrhoids, requesting rubber band ligation of his/her hemorrhoidal disease.  All risks, benefits and alternative forms of therapy were described and informed consent was obtained.   The anorectum was pre-medicated with 0.125% nitroglycerin and 5% lidocaine The decision was made to band the right posterior internal hemorrhoid, and the [place] was used to perform band ligation without complication.  Digital anorectal examination was then performed to assure proper positioning of the band, and to adjust the banded tissue as required.  The patient was discharged home without pain or other issues.  Dietary and behavioral recommendations were given and along with follow-up instructions.

## 2020-12-02 NOTE — Telephone Encounter (Signed)
Pt scheduled for Hemorrhoid Banding 12/02/2020 at 2:50 with Dr. Carlean Purl. Pt made aware. Pt verbalized understanding with all questions answered.

## 2020-12-02 NOTE — Telephone Encounter (Signed)
Pt states that she has had several bandings in the past done by Dr. Carlean Purl. Pt states that recently she has had on and off bleeding with one occurrence for  a large bleeding episode. Pt is requesting for an Hemorrhoid banding ASAP. Pt was offered an appointment for January although pt states that she will be out of town doing trade shows and  lots of traveling. Pt is requesting this to be done in December. Please advise

## 2020-12-02 NOTE — Patient Instructions (Signed)
HEMORRHOID BANDING PROCEDURE    FOLLOW-UP CARE   The procedure you have had should have been relatively painless since the banding of the area involved does not have nerve endings and there is no pain sensation.  The rubber band cuts off the blood supply to the hemorrhoid and the band may fall off as soon as 48 hours after the banding (the band may occasionally be seen in the toilet bowl following a bowel movement). You may notice a temporary feeling of fullness in the rectum which should respond adequately to plain Tylenol or Motrin.  Following the banding, avoid strenuous exercise that evening and resume full activity the next day.  A sitz bath (soaking in a warm tub) or bidet is soothing, and can be useful for cleansing the area after bowel movements.     To avoid constipation, take two tablespoons of natural wheat bran, natural oat bran, flax, Benefiber or any over the counter fiber supplement and increase your water intake to 7-8 glasses daily.    Unless you have been prescribed anorectal medication, do not put anything inside your rectum for two weeks: No suppositories, enemas, fingers, etc.  Occasionally, you may have more bleeding than usual after the banding procedure.  This is often from the untreated hemorrhoids rather than the treated one.  Don't be concerned if there is a tablespoon or so of blood.  If there is more blood than this, lie flat with your bottom higher than your head and apply an ice pack to the area. If the bleeding does not stop within a half an hour or if you feel faint, call our office at [phone] or go to the emergency room.  Problems are not common; however, if there is a substantial amount of bleeding, severe pain, chills, fever or difficulty passing urine (very rare) or other problems, you should call us at [phone] or report to the nearest emergency room.  Do not stay seated continuously for more than 2-3 hours for a day or two after the procedure.   Tighten your buttock muscles 10-15 times every two hours and take 10-15 deep breaths every 1-2 hours.  Do not spend more than a few minutes on the toilet if you cannot empty your bowel; instead re-visit the toilet at a later time.   On your way home today please stop and walk around some.  Follow up with Dr Carlean Purl as needed.   I appreciate the opportunity to care for you. Silvano Rusk, MD, [facility]

## 2020-12-02 NOTE — Telephone Encounter (Signed)
It is okay to work her in if there is an opening that has popped up or 350 appointment if 1 is available in December.  I think there would be 1.

## 2021-01-05 NOTE — Progress Notes (Deleted)
66 y.o. [id number] Divorced White or Caucasian Not Hispanic or Latino female here for annual exam.      Patient's last menstrual period was 01/01/2005.          Sexually active: {yes no:314532}  The current method of family planning is {contraception:315051}.    Exercising: {yes no:314532}  {types:19826} Smoker:  {YES NO:22349}  Health Maintenance: Pap:   01/07/20 WNL HR Hpv Neg  02/12/17 Neg              08/30/14 Neg. HR HPV:neg   History of abnormal Pap:  yes MMG:  02/04/20 Bi-rads 1 neg  BMD:    02/03/18 Osteopenia Colonoscopy: 02/18/20 f/u 5 years TDaP:  2015 Gardasil: n/a   reports that she has never smoked. She has never used smokeless tobacco. She reports current alcohol use of about 10.0 standard drinks per week. She reports that she does not currently use drugs.  Past Medical History:  Diagnosis Date   Endometrial polyp    GERD (gastroesophageal reflux disease)    hx of   History of adenomatous polyp of colon    multiple   History of esophagitis    and duodenitis   12/ 2006   Hyperlipidemia    on meds   IBS (irritable bowel syndrome)    Internal bleeding hemorrhoids - Gr 2 05/03/2015   Gr 2 RP LL RA - RP banded 05/03/2015 06/14/2015 RA and LL banded     Internal bleeding hemorrhoids - Gr 2 05/03/2015   Gr 2 RP LL RA - RP banded 05/03/2015 06/14/2015 RA and LL banded 01/12/2016 RA and RP banded    osteopenia    PMB (postmenopausal bleeding)    Rectal carcinoid tumor    removed EUS  03-10-2015   Wears glasses     Past Surgical History:  Procedure Laterality Date   BELPHAROPTOSIS REPAIR Left 2016   upper and lower    BREAST ENHANCEMENT SURGERY  Oct 2016   COLONOSCOPY  2017   CG-MAC-mira (Exc)-int hems/rectal mass   DILATATION & CURETTAGE/HYSTEROSCOPY WITH MYOSURE N/A 03/31/2015   Procedure: DILATATION & CURETTAGE/HYSTEROSCOPY WITH MYOSURE;  Surgeon: Megan Salon, MD;  Location: [facility];  Service: Gynecology;  Laterality: N/A;  endometrial polyp   DILATION AND  CURETTAGE OF UTERUS  1985   w/ suction   ESOPHAGOGASTRODUODENOSCOPY  12-12-2004   EUS N/A 03/10/2015   Procedure: LOWER ENDOSCOPIC ULTRASOUND (EUS);  Surgeon: [name], MD;  Location: [facility];  Service: Endoscopy;  Laterality: N/A;   HEMORRHOID BANDING  2017   TONSILLECTOMY  as child   UMBILICAL HERNIA REPAIR  1995   with Bilateral Tubal Ligation   WRIST GANGLION EXCISION Left 10-04-2000    Current Outpatient Medications  Medication Sig Dispense Refill   ALPRAZolam (XANAX) 0.5 MG tablet TAKE 1 TABLET AT BEDTIME 30 tablet 0   Calcium Citrate (CALCITRATE PO) Take by mouth.     Estradiol-Norethindrone Acet (MIMVEY LO) 0.5-0.1 MG tablet Take 1 tablet by mouth daily. (Patient taking differently: Take 0.5 tablets by mouth daily.) 90 tablet 3   fish oil-omega-3 fatty acids 1000 MG capsule Take 2 g by mouth daily.     fluticasone (FLONASE) 50 MCG/ACT nasal spray USE 1 TO SPRAYS TO EACH NOSTRIL AT BEDTIME FOR ALLERGIES 48 mL 3   hyoscyamine (LEVSIN) 0.125 MG tablet Take 1 tablet (0.125 mg total) by mouth every 6 (six) hours as needed for cramping. 60 tablet 2   loperamide (IMODIUM) 2 MG  capsule Take by mouth as needed for diarrhea or loose stools. Reported on 02/02/2015     Multiple Vitamin (MULTIVITAMIN) capsule Take 1 capsule by mouth 2 (two) times daily. Reported on 02/02/2015     No current facility-administered medications for this visit.    Family History  Problem Relation Age of Onset   Breast cancer Mother    Hypertension Mother    Osteoporosis Mother    COPD Father    Lung cancer Father    Colon polyps Father    Colon cancer Maternal Grandfather    Colon cancer Maternal Aunt    Colon cancer Paternal Aunt    Colon polyps Sister    Colon polyps Brother    Esophageal cancer Neg Hx    Rectal cancer Neg Hx    Stomach cancer Neg Hx     Review of Systems  Exam:   LMP 01/01/2005   Weight change: _0 @ Height:      Ht Readings from Last 3 Encounters:  12/02/20  _1  (1.626 m)  02/18/20 _2  (1.626 m)  01/28/20 _3  (1.626 m)    General appearance: alert, cooperative and appears stated age Head: Normocephalic, without obvious abnormality, atraumatic Neck: no adenopathy, supple, symmetrical, trachea midline and thyroid {CHL AMB PHY EX THYROID NORM DEFAULT:[phone]::"normal to inspection and palpation"} Lungs: clear to auscultation bilaterally Cardiovascular: regular rate and rhythm Breasts: {Exam; breast:13139::"normal appearance, no masses or tenderness"} Abdomen: soft, non-tender; non distended,  no masses,  no organomegaly Extremities: extremities normal, atraumatic, no cyanosis or edema Skin: Skin color, texture, turgor normal. No rashes or lesions Lymph nodes: Cervical, supraclavicular, and axillary nodes normal. No abnormal inguinal nodes palpated Neurologic: Grossly normal   Pelvic: External genitalia:  no lesions              Urethra:  normal appearing urethra with no masses, tenderness or lesions              Bartholins and Skenes: normal                 Vagina: normal appearing vagina with normal color and discharge, no lesions              Cervix: {CHL AMB PHY EX CERVIX NORM DEFAULT:[phone]::"no lesions"}               Bimanual Exam:  Uterus:  {CHL AMB PHY EX UTERUS NORM DEFAULT:[phone]::"normal size, contour, position, consistency, mobility, non-tender"}              Adnexa: {CHL AMB PHY EX ADNEXA NO MASS [phone]::"no mass, fullness, tenderness"}               Rectovaginal: Confirms               Anus:  normal sphincter tone, no lesions  *** chaperoned for the exam.  A:  Well Woman with normal exam  P:

## 2021-01-11 ENCOUNTER — Ambulatory Visit: Payer: BC Managed Care – PPO | Admitting: Obstetrics and Gynecology

## 2021-01-24 ENCOUNTER — Telehealth: Payer: Self-pay | Admitting: Internal Medicine

## 2021-01-24 NOTE — Telephone Encounter (Signed)
Patient called wanting to speak with you about if there is a medication that she can take that will not upset her stomach but will help her back. Seeking advice, please advise.

## 2021-01-24 NOTE — Telephone Encounter (Signed)
Any suggestions Sir?

## 2021-01-24 NOTE — Telephone Encounter (Signed)
Patient informed and said thank you for the help.

## 2021-01-24 NOTE — Telephone Encounter (Signed)
Acetaminophen will not upset stomach but I have no idea what "treating her back" means.  I think she needs to discuss with whomever is treating her back about this.

## 2021-05-12 ENCOUNTER — Ambulatory Visit (INDEPENDENT_AMBULATORY_CARE_PROVIDER_SITE_OTHER): Payer: BC Managed Care – PPO | Admitting: Obstetrics & Gynecology

## 2021-05-12 ENCOUNTER — Encounter (HOSPITAL_BASED_OUTPATIENT_CLINIC_OR_DEPARTMENT_OTHER): Payer: Self-pay | Admitting: Obstetrics & Gynecology

## 2021-05-12 VITALS — BP 126/66 | HR 63 | Ht 64.0 in | Wt 111.4 lb

## 2021-05-12 DIAGNOSIS — D369 Benign neoplasm, unspecified site: Secondary | ICD-10-CM | POA: Diagnosis not present

## 2021-05-12 DIAGNOSIS — Z85820 Personal history of malignant melanoma of skin: Secondary | ICD-10-CM

## 2021-05-12 DIAGNOSIS — Z780 Asymptomatic menopausal state: Secondary | ICD-10-CM

## 2021-05-12 DIAGNOSIS — Z01419 Encounter for gynecological examination (general) (routine) without abnormal findings: Secondary | ICD-10-CM

## 2021-05-12 MED ORDER — ESTRADIOL-NORETHINDRONE ACET 0.5-0.1 MG PO TABS: 1.0000 | ORAL_TABLET | Freq: Every day | ORAL | 12 refills | Status: DC

## 2021-05-12 NOTE — Progress Notes (Signed)
66 y.o. [id number] Married White or Caucasian female here for annual exam.   ? ?Patient's last menstrual period was 01/01/2005.          ?Sexually active: Yes.    ?The current method of family planning is post menopausal status.    ?Exercising: Yes.    ?Smoker:  no ? ?Health Maintenance: ?Pap:  01/2020 neg with neg HR HPV ?History of abnormal Pap:  remote hx ?MMG:  22022, has appt end of the month.  Will send me a copy ?Colonoscopy:  02/18/2020, follow up 5 years ?BMD:   2/32022, -1.5 ?Screening Labs: does with PCP ? ? reports that she has never smoked. She has never used smokeless tobacco. She reports current alcohol use of about 10.0 standard drinks per week. She reports that she does not currently use drugs. ? ?Past Medical History:  ?Diagnosis Date  ? Endometrial polyp   ? GERD (gastroesophageal reflux disease)   ? hx of  ? History of adenomatous polyp of colon   ? multiple  ? History of esophagitis   ? and duodenitis   12/ 2006  ? Hyperlipidemia   ? on meds  ? IBS (irritable bowel syndrome)   ? Internal bleeding hemorrhoids - Gr 2 05/03/2015  ? Gr 2 RP LL RA - RP banded 05/03/2015 06/14/2015 RA and LL banded    ? Internal bleeding hemorrhoids - Gr 2 05/03/2015  ? Gr 2 RP LL RA - RP banded 05/03/2015 06/14/2015 RA and LL banded 01/12/2016 RA and RP banded   ? osteopenia   ? PMB (postmenopausal bleeding)   ? Rectal carcinoid tumor   ? removed EUS  03-10-2015  ? Wears glasses   ? ? ?Past Surgical History:  ?Procedure Laterality Date  ? BELPHAROPTOSIS REPAIR Left 2016  ? upper and lower   ? BREAST ENHANCEMENT SURGERY  Oct 2016  ? COLONOSCOPY  2017  ? CG-MAC-mira (Exc)-int hems/rectal mass  ? DILATATION & CURETTAGE/HYSTEROSCOPY WITH MYOSURE N/A 03/31/2015  ? Procedure: [place];  Surgeon: Megan Salon, MD;  [facility];  Service: Gynecology;  Laterality: N/A;  endometrial polyp  ? DILATION AND CURETTAGE OF UTERUS  1985  ? w/ suction  ? ESOPHAGOGASTRODUODENOSCOPY   12-12-2004  ? EUS N/A 03/10/2015  ? Procedure: LOWER ENDOSCOPIC ULTRASOUND (EUS);  Surgeon: [name], MD;  Location: [facility];  Service: Endoscopy;  Laterality: N/A;  ? HEMORRHOID BANDING  2017  ? TONSILLECTOMY  as child  ? [place]  ? with Bilateral Tubal Ligation  ? WRIST GANGLION EXCISION Left 10-04-2000  ? ? ?Current Outpatient Medications  ?Medication Sig Dispense Refill  ? ALPRAZolam (XANAX) 0.5 MG tablet TAKE 1 TABLET AT BEDTIME 30 tablet 0  ? Calcium Citrate (CALCITRATE PO) Take by mouth.    ? Estradiol-Norethindrone Acet (MIMVEY LO) 0.5-0.1 MG tablet Take 1 tablet by mouth daily. (Patient taking differently: Take 0.5 tablets by mouth daily.) 90 tablet 3  ? fish oil-omega-3 fatty acids 1000 MG capsule Take 2 g by mouth daily.    ? fluticasone (FLONASE) 50 MCG/ACT nasal spray USE 1 TO SPRAYS TO EACH NOSTRIL AT BEDTIME FOR ALLERGIES 48 mL 3  ? hyoscyamine (LEVSIN) 0.125 MG tablet Take 1 tablet (0.125 mg total) by mouth every 6 (six) hours as needed for cramping. 60 tablet 2  ? loperamide (IMODIUM) 2 MG capsule Take by mouth as needed for diarrhea or loose stools. Reported on 02/02/2015    ?  Multiple Vitamin (MULTIVITAMIN) capsule Take 1 capsule by mouth 2 (two) times daily. Reported on 02/02/2015    ? ?No current facility-administered medications for this visit.  ? ? ?Family History  ?Problem Relation Age of Onset  ? Breast cancer Mother   ? Hypertension Mother   ? Osteoporosis Mother   ? COPD Father   ? Lung cancer Father   ? Colon polyps Father   ? Colon cancer Maternal Grandfather   ? Colon cancer Maternal Aunt   ? Colon cancer Paternal Aunt   ? Colon polyps Sister   ? Colon polyps Brother   ? Esophageal cancer Neg Hx   ? Rectal cancer Neg Hx   ? Stomach cancer Neg Hx   ? ? ?Genitourinary:negative ? ?Exam:   ?BP 126/66 (BP Location: Left Arm, Patient Position: Sitting, Cuff Size: Normal)   Pulse 63   Ht _0  (1.626 m) Comment: reported  Wt 111 lb 6.4 oz (50.5 kg)   LMP  01/01/2005   BMI 19.12 kg/m?   Height: _1  (162.6 cm) (reported) ? ?General appearance: alert, cooperative and appears stated age ?Head: Normocephalic, without obvious abnormality, atraumatic ?Neck: no adenopathy, supple, symmetrical, trachea midline and thyroid normal to inspection and palpation ?Lungs: clear to auscultation bilaterally ?Breasts: normal appearance, no masses or tenderness ?Heart: regular rate and rhythm ?Abdomen: soft, non-tender; bowel sounds normal; no masses,  no organomegaly ?Extremities: extremities normal, atraumatic, no cyanosis or edema ?Skin: Skin color, texture, turgor normal. No rashes or lesions ?Lymph nodes: Cervical, supraclavicular, and axillary nodes normal. ?No abnormal inguinal nodes palpated ?Neurologic: Grossly normal ? ? ?Pelvic: External genitalia:  no lesions ?             Urethra:  normal appearing urethra with no masses, tenderness or lesions ?             Bartholins and Skenes: normal    ?             Vagina: normal appearing vagina with normal color and no discharge, no lesions ?             Cervix: no lesions ?             Pap taken: Yes.   ?Bimanual Exam:  Uterus:  normal size, contour, position, consistency, mobility, non-tender ?             Adnexa: normal adnexa and no mass, fullness, tenderness ?              Rectovaginal: Confirms ?              Anus:  normal sphincter tone, no lesions ? ?Chaperone, Octaviano Batty, CMA, was present for exam. ? ?Assessment/Plan: ?1. Well woman exam with routine gynecological exam ?- pap neg with neg HR HPV 2022 ?- MMG is scheduled for end of the month ?- colonoscopy 2022 ?-BMD 2020 ?- lab work done with PCP ?- vaccines reviewed ? ?2. Postmenopausal ?- RF for estradiol norethindrone 0.5/0.1.  pt takes 1/4 tablets daily.   ? ?3. Hx of melanoma of skin ?- has new dermatologist ? ?4. Adenomatous polyp ?- has colonoscopy ever 5 years ? ? ?

## 2023-01-01 ENCOUNTER — Other Ambulatory Visit (HOSPITAL_BASED_OUTPATIENT_CLINIC_OR_DEPARTMENT_OTHER): Payer: Self-pay | Admitting: Obstetrics & Gynecology

## 2023-01-10 ENCOUNTER — Telehealth: Payer: Self-pay | Admitting: Internal Medicine

## 2023-01-10 ENCOUNTER — Other Ambulatory Visit: Payer: Self-pay

## 2023-01-10 MED ORDER — HYDROCORTISONE ACETATE 25 MG RE SUPP: 25.0000 mg | Freq: Every evening | RECTAL | 0 refills | Status: DC | PRN

## 2023-01-10 MED ORDER — HYDROCORTISONE ACETATE 25 MG RE SUPP: 25.0000 mg | Freq: Every evening | RECTAL | 0 refills | Status: AC | PRN

## 2023-01-10 NOTE — Telephone Encounter (Signed)
 Patient would like a f/u call to discuss symptoms and next plan of treatment in the meantime until she is able to come in . Please advise.

## 2023-01-10 NOTE — Telephone Encounter (Signed)
 I sent in the following:   Meds ordered this encounter  Medications   hydrocortisone  (ANUSOL -HC) 25 MG suppository    Sig: Place 1 suppository (25 mg total) rectally at bedtime as needed for hemorrhoids or anal itching.    Dispense:  12 suppository    Refill:  0

## 2023-01-10 NOTE — Telephone Encounter (Signed)
 Returned patient call & she would like to know what she can do in the meantime for internal hemorrhoids. For the last week, she is feeling a lot of pressure/discomfort & noticing some bleeding when she has a bowel movement. She's not tried anything OTC, as she feels this is more internal. She is scheduled to see Dr. Avram for OV in March. Last seen for banding appointment 12/2020.

## 2023-01-10 NOTE — Telephone Encounter (Signed)
 Made patient aware of prescription that MD sent in & changed prescription to go to preferred pharmacy. Pt advised to call back if symptoms unrelieved. Pt verbalized all understanding.

## 2023-03-21 ENCOUNTER — Ambulatory Visit: Payer: BC Managed Care – PPO | Admitting: Internal Medicine

## 2023-05-20 ENCOUNTER — Ambulatory Visit (HOSPITAL_BASED_OUTPATIENT_CLINIC_OR_DEPARTMENT_OTHER): Payer: BC Managed Care – PPO | Admitting: Obstetrics & Gynecology

## 2023-07-02 ENCOUNTER — Other Ambulatory Visit (HOSPITAL_BASED_OUTPATIENT_CLINIC_OR_DEPARTMENT_OTHER): Payer: Self-pay | Admitting: Obstetrics & Gynecology

## 2023-07-16 ENCOUNTER — Other Ambulatory Visit (HOSPITAL_COMMUNITY)
Admission: RE | Admit: 2023-07-16 | Discharge: 2023-07-16 | Disposition: A | Discharge status: Home / Self Care | Source: Ambulatory Visit | Attending: Obstetrics & Gynecology | Admitting: Obstetrics & Gynecology

## 2023-07-16 ENCOUNTER — Ambulatory Visit (HOSPITAL_BASED_OUTPATIENT_CLINIC_OR_DEPARTMENT_OTHER): Admitting: Obstetrics & Gynecology

## 2023-07-16 VITALS — BP 122/80 | HR 59 | Wt 114.5 lb

## 2023-07-16 DIAGNOSIS — Z01419 Encounter for gynecological examination (general) (routine) without abnormal findings: Secondary | ICD-10-CM | POA: Diagnosis not present

## 2023-07-16 DIAGNOSIS — Z124 Encounter for screening for malignant neoplasm of cervix: Secondary | ICD-10-CM | POA: Insufficient documentation

## 2023-07-16 DIAGNOSIS — M85851 Other specified disorders of bone density and structure, right thigh: Secondary | ICD-10-CM

## 2023-07-16 DIAGNOSIS — Z79890 Hormone replacement therapy (postmenopausal): Secondary | ICD-10-CM

## 2023-07-16 DIAGNOSIS — M85852 Other specified disorders of bone density and structure, left thigh: Secondary | ICD-10-CM

## 2023-07-16 MED ORDER — ESTRADIOL-NORETHINDRONE ACET 0.5-0.1 MG PO TABS: 1.0000 | ORAL_TABLET | Freq: Every day | ORAL | 3 refills | Status: AC

## 2023-07-16 NOTE — Progress Notes (Unsigned)
 Annual Exam Patient name: Valerie Hubbard MRN [id number]  Date of birth: [birth year] Chief Complaint:   Annual Exam  History of Present Illness:   Valerie Hubbard is a 68 y.o. [id number] Caucasian female being seen for annual exam.  Doing well.  Denies vaginal bleeding.  Married now three years.  Considering moving all her care to closer to home.    Patient's last menstrual period was 01/01/2005.  Last pap 2022. Last mammogram: just done yesterday.  07/10/2022 results in [facility]. Last colonoscopy: 2022. F/u 5 years.  Dexa:  10/30/2022, T -1.6      05/12/2021   10:56 AM  Depression screen PHQ 2/9  Decreased Interest 0  Down, Depressed, Hopeless 0  PHQ - 2 Score 0    Review of Systems:   Pertinent items are noted in HPI Denies any urinary or bowel changes.  Denies pelvic pain.   Pertinent History Reviewed:  Reviewed past medical,surgical, social and family history.  Reviewed problem list, medications and allergies. Physical Assessment:   Vitals:   07/16/23 1059  BP: 122/80  Pulse: (!) 59  SpO2: 100%  Weight: 114 lb 8 oz (51.9 kg)  Body mass index is 19.65 kg/m.        Physical Examination:   General appearance - well appearing, and in no distress  Mental status - alert, oriented to person, place, and time  Psych:  She has a normal mood and affect  Skin - warm and dry, normal color, no suspicious lesions noted  Chest - effort normal, all lung fields clear to auscultation bilaterally  Heart - normal rate and regular rhythm  Neck:  midline trachea, no thyromegaly or nodules  Breasts - breasts appear normal, no suspicious masses, no skin or nipple changes or  axillary nodes, implants present  Abdomen - soft, nontender, nondistended, no masses or organomegaly  Pelvic - VULVA: normal appearing vulva with no masses, tenderness or lesions VAGINA: normal appearing vagina with normal color and discharge, no lesions  CERVIX: normal appearing cervix without discharge or lesions,  no CMT  Thin prep pap is updated today  UTERUS: uterus is felt to be normal size, shape, consistency and nontender   ADNEXA: No adnexal masses or tenderness noted.  Rectal - normal rectal, good sphincter tone, no masses felt.   Extremities:  No swelling or varicosities noted  Chaperone present for exam  No results found for this or any previous visit (from the past 24 hours).  Assessment & Plan:  1. Encounter for well woman exam with routine gynecological exam (Primary) - Pap smear 2022.  Updated today. - Mammogram 07/10/2022 - Colonoscopy 2022.  Follow up 5 years.  - Bone mineral density 10/30/2022 with t score -1.6 - lab work done with PCP, Dr. Kingston - vaccines reviewed/updated  2. Cervical cancer screening - Cytology - PAP( [place])  3. Hormone replacement therapy (HRT) - Estradiol -Norethindrone  Acet 0.5-0.1 MG tablet; Take 1 tablet by mouth daily.  Dispense: 90 tablet; Refill: 3  4.  Osteopenia of bilateral hips - taking calcium.  Will repeat 2- 3 years.  No orders of the defined types were placed in this encounter.   Meds:  Meds ordered this encounter  Medications   Estradiol -Norethindrone  Acet 0.5-0.1 MG tablet    Sig: Take 1 tablet by mouth daily.    Dispense:  90 tablet    Refill:  3    Patient will receive additional refills at next office visit    Follow-up: Return  in about 1 year (around 07/15/2024).  Ronal GORMAN Pinal, MD 07/19/2023 5:54 PM

## 2023-07-26 LAB — CYTOLOGY - PAP
Comment: NEGATIVE
Diagnosis: UNDETERMINED — AB
Diagnosis: UNDETERMINED — AB

## 2023-07-30 ENCOUNTER — Ambulatory Visit (HOSPITAL_BASED_OUTPATIENT_CLINIC_OR_DEPARTMENT_OTHER): Payer: Self-pay | Admitting: Obstetrics & Gynecology
